# Patient Record
Sex: Male | Born: 1955 | Race: White | Hispanic: No | Smoking: Never smoker
Health system: Southern US, Community
[De-identification: ages and names within clinical notes are randomized; demographics above are authoritative.]

## PROBLEM LIST (undated history)

## (undated) DIAGNOSIS — G4730 Sleep apnea, unspecified: Secondary | ICD-10-CM

## (undated) DIAGNOSIS — E119 Type 2 diabetes mellitus without complications: Secondary | ICD-10-CM

## (undated) DIAGNOSIS — T8859XA Other complications of anesthesia, initial encounter: Secondary | ICD-10-CM

## (undated) DIAGNOSIS — Z87442 Personal history of urinary calculi: Secondary | ICD-10-CM

## (undated) DIAGNOSIS — I10 Essential (primary) hypertension: Secondary | ICD-10-CM

## (undated) DIAGNOSIS — T4145XA Adverse effect of unspecified anesthetic, initial encounter: Secondary | ICD-10-CM

## (undated) DIAGNOSIS — I639 Cerebral infarction, unspecified: Secondary | ICD-10-CM

## (undated) HISTORY — PX: OTHER SURGICAL HISTORY: SHX169

---

## 2015-02-12 ENCOUNTER — Ambulatory Visit (INDEPENDENT_AMBULATORY_CARE_PROVIDER_SITE_OTHER): Payer: No Typology Code available for payment source | Admitting: Urology

## 2015-02-12 ENCOUNTER — Other Ambulatory Visit: Payer: Self-pay | Admitting: Urology

## 2015-02-12 DIAGNOSIS — N401 Enlarged prostate with lower urinary tract symptoms: Secondary | ICD-10-CM

## 2015-02-12 DIAGNOSIS — F5221 Male erectile disorder: Secondary | ICD-10-CM

## 2015-02-12 DIAGNOSIS — N402 Nodular prostate without lower urinary tract symptoms: Secondary | ICD-10-CM

## 2015-02-12 DIAGNOSIS — Z87442 Personal history of urinary calculi: Secondary | ICD-10-CM

## 2015-02-19 ENCOUNTER — Other Ambulatory Visit: Payer: Self-pay | Admitting: Urology

## 2015-02-19 DIAGNOSIS — N402 Nodular prostate without lower urinary tract symptoms: Secondary | ICD-10-CM

## 2015-02-26 ENCOUNTER — Encounter (HOSPITAL_COMMUNITY): Payer: Self-pay

## 2015-02-26 ENCOUNTER — Ambulatory Visit (HOSPITAL_COMMUNITY)
Admission: RE | Admit: 2015-02-26 | Discharge: 2015-02-26 | Disposition: A | Discharge status: Home / Self Care | Payer: No Typology Code available for payment source | Source: Ambulatory Visit | Attending: Urology | Admitting: Urology

## 2015-02-26 DIAGNOSIS — N402 Nodular prostate without lower urinary tract symptoms: Secondary | ICD-10-CM | POA: Insufficient documentation

## 2015-02-26 DIAGNOSIS — R972 Elevated prostate specific antigen [PSA]: Secondary | ICD-10-CM | POA: Diagnosis not present

## 2015-02-26 HISTORY — DX: Type 2 diabetes mellitus without complications: E11.9

## 2015-02-26 HISTORY — DX: Essential (primary) hypertension: I10

## 2015-02-26 MED ORDER — GENTAMICIN SULFATE 40 MG/ML IJ SOLN
INTRAMUSCULAR | Status: AC
  Administered 2015-02-26: 80 mg via INTRAMUSCULAR
  Filled 2015-02-26: qty 2

## 2015-02-26 MED ORDER — LIDOCAINE HCL (PF) 2 % IJ SOLN
INTRAMUSCULAR | Status: AC
  Administered 2015-02-26: 10 mL
  Filled 2015-02-26: qty 10

## 2015-02-26 MED ORDER — GENTAMICIN SULFATE 40 MG/ML IJ SOLN
80.0000 mg | Freq: Once | INTRAMUSCULAR | Status: AC
  Administered 2015-02-26: 80 mg via INTRAMUSCULAR

## 2015-02-26 MED ORDER — LIDOCAINE HCL (PF) 2 % IJ SOLN
10.0000 mL | Freq: Once | INTRAMUSCULAR | Status: AC
  Administered 2015-02-26: 10 mL

## 2015-02-26 NOTE — Discharge Instructions (Signed)
Transrectal Ultrasound-Guided Biopsy °A transrectal ultrasound-guided biopsy is a procedure to take samples of tissue from your prostate. Ultrasound images are used to guide the procedure. It is usually done to check the prostate gland for cancer. °BEFORE THE PROCEDURE °· Do not eat or drink after midnight on the night before your procedure. °· Take medicines as your doctor tells you. °· Your doctor may have you stop taking some medicines 5-7 days before the procedure. °· You will be given an enema before your procedure. During an enema, a liquid is put into your butt (rectum) to clear out waste. °· You may have lab tests the day of your procedure. °· Make plans to have someone drive you home. °PROCEDURE °· You will be given medicine to help you relax before the procedure. An IV tube will be put into one of your veins. It will be used to give fluids and medicine. °· You will be given medicine to reduce the risk of infection (antibiotic). °· You will be placed on your side. °· A probe with gel will be put in your butt. This is used to take pictures of your prostate and the area around it. °· A medicine to numb the area is put into your prostate. °· A biopsy needle is then inserted and guided to your prostate. °· Samples of prostate tissue are taken. The needle is removed. °· The samples are sent to a lab to be checked. Results are usually back in 2-3 days. °AFTER THE PROCEDURE °· You will be taken to a room where you will be watched until you are doing okay. °· You may have some pain in the area around your butt. You will be given medicines for this. °· You may be able to go home the same day. Sometimes, an overnight stay in the hospital is needed. °  °This information is not intended to replace advice given to you by your health care provider. Make sure you discuss any questions you have with your health care provider. °  °Document Released: 03/03/2009 Document Revised: 03/20/2013 Document Reviewed:  11/01/2012 °Elsevier Interactive Patient Education ©2016 [employer] ° °

## 2015-03-05 ENCOUNTER — Ambulatory Visit (INDEPENDENT_AMBULATORY_CARE_PROVIDER_SITE_OTHER): Payer: No Typology Code available for payment source | Admitting: Urology

## 2015-03-05 DIAGNOSIS — N401 Enlarged prostate with lower urinary tract symptoms: Secondary | ICD-10-CM

## 2015-03-05 DIAGNOSIS — N402 Nodular prostate without lower urinary tract symptoms: Secondary | ICD-10-CM | POA: Diagnosis not present

## 2015-06-04 ENCOUNTER — Ambulatory Visit (INDEPENDENT_AMBULATORY_CARE_PROVIDER_SITE_OTHER): Payer: No Typology Code available for payment source | Admitting: Urology

## 2015-06-04 DIAGNOSIS — N401 Enlarged prostate with lower urinary tract symptoms: Secondary | ICD-10-CM | POA: Diagnosis not present

## 2015-06-04 DIAGNOSIS — N402 Nodular prostate without lower urinary tract symptoms: Secondary | ICD-10-CM

## 2015-06-04 DIAGNOSIS — F5221 Male erectile disorder: Secondary | ICD-10-CM | POA: Diagnosis not present

## 2015-08-06 ENCOUNTER — Ambulatory Visit: Payer: No Typology Code available for payment source | Admitting: Urology

## 2017-10-26 ENCOUNTER — Ambulatory Visit (HOSPITAL_COMMUNITY)
Admission: RE | Admit: 2017-10-26 | Discharge: 2017-10-26 | Disposition: A | Discharge status: Home / Self Care | Payer: No Typology Code available for payment source | Source: Ambulatory Visit | Attending: Urology | Admitting: Urology

## 2017-10-26 ENCOUNTER — Other Ambulatory Visit: Payer: Self-pay | Admitting: Urology

## 2017-10-26 ENCOUNTER — Ambulatory Visit (INDEPENDENT_AMBULATORY_CARE_PROVIDER_SITE_OTHER): Payer: No Typology Code available for payment source | Admitting: Urology

## 2017-10-26 DIAGNOSIS — N200 Calculus of kidney: Secondary | ICD-10-CM

## 2017-10-26 DIAGNOSIS — N201 Calculus of ureter: Secondary | ICD-10-CM

## 2017-10-27 ENCOUNTER — Ambulatory Visit (HOSPITAL_COMMUNITY): Payer: No Typology Code available for payment source | Admitting: Anesthesiology

## 2017-10-27 ENCOUNTER — Inpatient Hospital Stay (HOSPITAL_COMMUNITY)
Admission: AD | Admit: 2017-10-27 | Discharge: 2017-11-01 | DRG: 694 | Disposition: A | Discharge status: Home / Self Care | Payer: No Typology Code available for payment source | Source: Ambulatory Visit | Attending: Urology | Admitting: Urology

## 2017-10-27 ENCOUNTER — Encounter (HOSPITAL_COMMUNITY): Payer: Self-pay | Admitting: *Deleted

## 2017-10-27 ENCOUNTER — Encounter (HOSPITAL_COMMUNITY)
Admission: AD | Disposition: A | Discharge status: Home / Self Care | Payer: Self-pay | Source: Ambulatory Visit | Attending: Urology

## 2017-10-27 ENCOUNTER — Ambulatory Visit (HOSPITAL_COMMUNITY): Payer: No Typology Code available for payment source

## 2017-10-27 DIAGNOSIS — N132 Hydronephrosis with renal and ureteral calculous obstruction: Secondary | ICD-10-CM | POA: Diagnosis present

## 2017-10-27 DIAGNOSIS — N210 Calculus in bladder: Secondary | ICD-10-CM | POA: Diagnosis present

## 2017-10-27 DIAGNOSIS — Z8673 Personal history of transient ischemic attack (TIA), and cerebral infarction without residual deficits: Secondary | ICD-10-CM

## 2017-10-27 DIAGNOSIS — Z7982 Long term (current) use of aspirin: Secondary | ICD-10-CM | POA: Diagnosis not present

## 2017-10-27 DIAGNOSIS — Z794 Long term (current) use of insulin: Secondary | ICD-10-CM | POA: Diagnosis not present

## 2017-10-27 DIAGNOSIS — Z79899 Other long term (current) drug therapy: Secondary | ICD-10-CM | POA: Diagnosis not present

## 2017-10-27 DIAGNOSIS — I10 Essential (primary) hypertension: Secondary | ICD-10-CM | POA: Diagnosis present

## 2017-10-27 DIAGNOSIS — E119 Type 2 diabetes mellitus without complications: Secondary | ICD-10-CM | POA: Diagnosis present

## 2017-10-27 DIAGNOSIS — R310 Gross hematuria: Secondary | ICD-10-CM | POA: Diagnosis present

## 2017-10-27 DIAGNOSIS — Z9889 Other specified postprocedural states: Secondary | ICD-10-CM

## 2017-10-27 DIAGNOSIS — N201 Calculus of ureter: Secondary | ICD-10-CM | POA: Diagnosis present

## 2017-10-27 HISTORY — PX: CYSTOSCOPY WITH LITHOLAPAXY: SHX1425

## 2017-10-27 HISTORY — DX: Cerebral infarction, unspecified: I63.9

## 2017-10-27 HISTORY — PX: HOLMIUM LASER APPLICATION: SHX5852

## 2017-10-27 LAB — BASIC METABOLIC PANEL
Anion gap: 11 (ref 5–15)
BUN: 17 mg/dL (ref 8–23)
CHLORIDE: 110 mmol/L (ref 98–111)
CO2: 25 mmol/L (ref 22–32)
CREATININE: 2.01 mg/dL — AB (ref 0.61–1.24)
Calcium: 9.4 mg/dL (ref 8.9–10.3)
GFR calc Af Amer: 39 mL/min — ABNORMAL LOW (ref >60)
GFR calc non Af Amer: 34 mL/min — ABNORMAL LOW (ref >60)
GLUCOSE: 154 mg/dL — AB (ref 70–99)
Potassium: 3.9 mmol/L (ref 3.5–5.1)
SODIUM: 146 mmol/L — AB (ref 135–145)

## 2017-10-27 LAB — CBC
HCT: 33.7 % — ABNORMAL LOW (ref 39.0–52.0)
Hemoglobin: 11.3 g/dL — ABNORMAL LOW (ref 13.0–17.0)
MCH: 29.1 pg (ref 26.0–34.0)
MCHC: 33.5 g/dL (ref 30.0–36.0)
MCV: 86.9 fL (ref 78.0–100.0)
PLATELETS: 176 10*3/uL (ref 150–400)
RBC: 3.88 MIL/uL — ABNORMAL LOW (ref 4.22–5.81)
RDW: 12.6 % (ref 11.5–15.5)
WBC: 5.6 10*3/uL (ref 4.0–10.5)

## 2017-10-27 LAB — HEMOGLOBIN A1C
Hgb A1c MFr Bld: 6.5 % — ABNORMAL HIGH (ref 4.8–5.6)
MEAN PLASMA GLUCOSE: 139.85 mg/dL

## 2017-10-27 LAB — MRSA PCR SCREENING: MRSA by PCR: NEGATIVE

## 2017-10-27 LAB — GLUCOSE, CAPILLARY
GLUCOSE-CAPILLARY: 154 mg/dL — AB (ref 70–99)
Glucose-Capillary: 130 mg/dL — ABNORMAL HIGH (ref 70–99)

## 2017-10-27 LAB — PROTIME-INR
INR: 1.09
PROTHROMBIN TIME: 14.1 s (ref 11.4–15.2)

## 2017-10-27 SURGERY — CYSTOSCOPY, WITH BLADDER CALCULUS LITHOLAPAXY
Anesthesia: General

## 2017-10-27 MED ORDER — DIPHENHYDRAMINE HCL 12.5 MG/5ML PO ELIX: 12.5000 mg | ORAL_SOLUTION | Freq: Four times a day (QID) | ORAL | Status: DC | PRN

## 2017-10-27 MED ORDER — MIDAZOLAM HCL 2 MG/2ML IJ SOLN
INTRAMUSCULAR | Status: DC | PRN
  Administered 2017-10-27: 2 mg via INTRAVENOUS

## 2017-10-27 MED ORDER — HYOSCYAMINE SULFATE 0.125 MG SL SUBL
0.1250 mg | SUBLINGUAL_TABLET | SUBLINGUAL | Status: DC | PRN
  Filled 2017-10-27: qty 1

## 2017-10-27 MED ORDER — MIDAZOLAM HCL 2 MG/2ML IJ SOLN
INTRAMUSCULAR | Status: AC
  Filled 2017-10-27: qty 2

## 2017-10-27 MED ORDER — LACTATED RINGERS IV SOLN
INTRAVENOUS | Status: DC
  Administered 2017-10-27: 14:00:00 via INTRAVENOUS

## 2017-10-27 MED ORDER — HYDRALAZINE HCL 20 MG/ML IJ SOLN
10.0000 mg | Freq: Once | INTRAMUSCULAR | Status: AC
  Administered 2017-10-27: 10 mg via INTRAVENOUS

## 2017-10-27 MED ORDER — FENTANYL CITRATE (PF) 100 MCG/2ML IJ SOLN
INTRAMUSCULAR | Status: AC
  Administered 2017-10-27: 50 ug via INTRAVENOUS
  Filled 2017-10-27: qty 2

## 2017-10-27 MED ORDER — LIDOCAINE 2% (20 MG/ML) 5 ML SYRINGE
INTRAMUSCULAR | Status: AC
  Filled 2017-10-27: qty 5

## 2017-10-27 MED ORDER — HYDROMORPHONE HCL 1 MG/ML IJ SOLN
INTRAMUSCULAR | Status: AC
  Administered 2017-10-27: 0.5 mg via INTRAVENOUS
  Filled 2017-10-27: qty 1

## 2017-10-27 MED ORDER — AMLODIPINE BESYLATE 5 MG PO TABS
5.0000 mg | ORAL_TABLET | Freq: Every day | ORAL | Status: DC
  Administered 2017-10-27 – 2017-11-01 (×6): 5 mg via ORAL
  Filled 2017-10-27 (×6): qty 1

## 2017-10-27 MED ORDER — ZOLPIDEM TARTRATE 5 MG PO TABS
5.0000 mg | ORAL_TABLET | Freq: Every evening | ORAL | Status: DC | PRN
  Administered 2017-10-28 – 2017-10-29 (×3): 5 mg via ORAL
  Filled 2017-10-27 (×3): qty 1

## 2017-10-27 MED ORDER — SODIUM CHLORIDE 0.9 % IR SOLN
Status: DC | PRN
  Administered 2017-10-27: 6000 mL

## 2017-10-27 MED ORDER — HYDROMORPHONE HCL 1 MG/ML IJ SOLN
0.2500 mg | INTRAMUSCULAR | Status: DC | PRN
  Administered 2017-10-27 (×4): 0.5 mg via INTRAVENOUS

## 2017-10-27 MED ORDER — LABETALOL HCL 5 MG/ML IV SOLN
INTRAVENOUS | Status: AC
  Administered 2017-10-27: 10 mg via INTRAVENOUS
  Filled 2017-10-27: qty 4

## 2017-10-27 MED ORDER — BELLADONNA ALKALOIDS-OPIUM 16.2-60 MG RE SUPP
1.0000 | Freq: Four times a day (QID) | RECTAL | Status: DC | PRN
  Administered 2017-10-27 (×2): 1 via RECTAL
  Filled 2017-10-27: qty 1

## 2017-10-27 MED ORDER — OXYBUTYNIN CHLORIDE 5 MG PO TABS
5.0000 mg | ORAL_TABLET | Freq: Three times a day (TID) | ORAL | Status: DC | PRN
  Administered 2017-10-27 – 2017-11-01 (×9): 5 mg via ORAL
  Filled 2017-10-27 (×9): qty 1

## 2017-10-27 MED ORDER — ONDANSETRON HCL 4 MG/2ML IJ SOLN
INTRAMUSCULAR | Status: DC | PRN
  Administered 2017-10-27: 4 mg via INTRAVENOUS

## 2017-10-27 MED ORDER — PHENYLEPHRINE 40 MCG/ML (10ML) SYRINGE FOR IV PUSH (FOR BLOOD PRESSURE SUPPORT)
PREFILLED_SYRINGE | INTRAVENOUS | Status: AC
  Filled 2017-10-27: qty 10

## 2017-10-27 MED ORDER — BELLADONNA ALKALOIDS-OPIUM 16.2-60 MG RE SUPP
RECTAL | Status: AC
  Administered 2017-10-27: 1 via RECTAL
  Filled 2017-10-27: qty 1

## 2017-10-27 MED ORDER — ONDANSETRON HCL 4 MG/2ML IJ SOLN: 4.0000 mg | Freq: Once | INTRAMUSCULAR | Status: DC | PRN

## 2017-10-27 MED ORDER — CEFAZOLIN SODIUM-DEXTROSE 2-4 GM/100ML-% IV SOLN
2.0000 g | INTRAVENOUS | Status: AC
  Administered 2017-10-27: 2 g via INTRAVENOUS
  Filled 2017-10-27: qty 100

## 2017-10-27 MED ORDER — OXYCODONE HCL 5 MG PO TABS
ORAL_TABLET | ORAL | Status: AC
  Administered 2017-10-27: 5 mg via ORAL
  Filled 2017-10-27: qty 1

## 2017-10-27 MED ORDER — PHENYLEPHRINE 40 MCG/ML (10ML) SYRINGE FOR IV PUSH (FOR BLOOD PRESSURE SUPPORT)
PREFILLED_SYRINGE | INTRAVENOUS | Status: DC | PRN
  Administered 2017-10-27: 40 ug via INTRAVENOUS

## 2017-10-27 MED ORDER — STERILE WATER FOR IRRIGATION IR SOLN
Status: DC | PRN
  Administered 2017-10-27: 30 mL

## 2017-10-27 MED ORDER — OXYCODONE HCL 5 MG PO TABS
5.0000 mg | ORAL_TABLET | Freq: Once | ORAL | Status: AC | PRN
  Administered 2017-10-27 (×2): 5 mg via ORAL

## 2017-10-27 MED ORDER — FENTANYL CITRATE (PF) 100 MCG/2ML IJ SOLN
INTRAMUSCULAR | Status: AC
  Filled 2017-10-27: qty 2

## 2017-10-27 MED ORDER — SODIUM CHLORIDE 0.9 % IR SOLN
3000.0000 mL | Status: DC
  Administered 2017-10-27 – 2017-11-01 (×8): 3000 mL

## 2017-10-27 MED ORDER — PROPOFOL 10 MG/ML IV BOLUS
INTRAVENOUS | Status: DC | PRN
  Administered 2017-10-27: 200 mg via INTRAVENOUS

## 2017-10-27 MED ORDER — HYDRALAZINE HCL 20 MG/ML IJ SOLN
INTRAMUSCULAR | Status: AC
  Filled 2017-10-27: qty 1

## 2017-10-27 MED ORDER — DIPHENHYDRAMINE HCL 50 MG/ML IJ SOLN: 12.5000 mg | Freq: Four times a day (QID) | INTRAMUSCULAR | Status: DC | PRN

## 2017-10-27 MED ORDER — DEXAMETHASONE SODIUM PHOSPHATE 10 MG/ML IJ SOLN
INTRAMUSCULAR | Status: DC | PRN
  Administered 2017-10-27: 10 mg via INTRAVENOUS

## 2017-10-27 MED ORDER — FENTANYL CITRATE (PF) 100 MCG/2ML IJ SOLN
INTRAMUSCULAR | Status: DC | PRN
  Administered 2017-10-27 (×2): 50 ug via INTRAVENOUS
  Administered 2017-10-27 (×2): 25 ug via INTRAVENOUS
  Administered 2017-10-27: 50 ug via INTRAVENOUS

## 2017-10-27 MED ORDER — SODIUM CHLORIDE 0.9 % IV SOLN
INTRAVENOUS | Status: DC
  Administered 2017-10-27 – 2017-11-01 (×8): via INTRAVENOUS

## 2017-10-27 MED ORDER — ATORVASTATIN CALCIUM 40 MG PO TABS
80.0000 mg | ORAL_TABLET | Freq: Every day | ORAL | Status: DC
  Administered 2017-10-27 – 2017-11-01 (×6): 80 mg via ORAL
  Filled 2017-10-27 (×6): qty 2

## 2017-10-27 MED ORDER — DEXAMETHASONE SODIUM PHOSPHATE 10 MG/ML IJ SOLN
INTRAMUSCULAR | Status: AC
  Filled 2017-10-27: qty 1

## 2017-10-27 MED ORDER — MIDAZOLAM HCL 2 MG/2ML IJ SOLN
2.0000 mg | Freq: Once | INTRAMUSCULAR | Status: AC
  Administered 2017-10-27: 2 mg via INTRAVENOUS

## 2017-10-27 MED ORDER — HYDROMORPHONE HCL 1 MG/ML IJ SOLN
0.5000 mg | INTRAMUSCULAR | Status: DC | PRN
  Administered 2017-10-27 (×2): 1 mg via INTRAVENOUS
  Filled 2017-10-27 (×2): qty 1

## 2017-10-27 MED ORDER — ONDANSETRON HCL 4 MG/2ML IJ SOLN
INTRAMUSCULAR | Status: AC
  Filled 2017-10-27: qty 2

## 2017-10-27 MED ORDER — HYDROMORPHONE HCL 1 MG/ML IJ SOLN: 0.5000 mg | INTRAMUSCULAR | Status: DC | PRN

## 2017-10-27 MED ORDER — PROPOFOL 10 MG/ML IV BOLUS
INTRAVENOUS | Status: AC
  Filled 2017-10-27: qty 20

## 2017-10-27 MED ORDER — OXYCODONE-ACETAMINOPHEN 5-325 MG PO TABS
1.0000 | ORAL_TABLET | ORAL | Status: DC | PRN
  Administered 2017-10-27 – 2017-10-29 (×4): 2 via ORAL
  Administered 2017-10-30: 1 via ORAL
  Administered 2017-10-30 (×2): 2 via ORAL
  Administered 2017-10-31 – 2017-11-01 (×2): 1 via ORAL
  Filled 2017-10-27 (×2): qty 2
  Filled 2017-10-27: qty 1
  Filled 2017-10-27 (×2): qty 2
  Filled 2017-10-27: qty 1
  Filled 2017-10-27: qty 2
  Filled 2017-10-27: qty 1
  Filled 2017-10-27: qty 2

## 2017-10-27 MED ORDER — HYDROMORPHONE HCL 1 MG/ML IJ SOLN
INTRAMUSCULAR | Status: AC
  Filled 2017-10-27: qty 1

## 2017-10-27 MED ORDER — OXYCODONE HCL 5 MG/5ML PO SOLN
5.0000 mg | Freq: Once | ORAL | Status: AC | PRN
  Filled 2017-10-27: qty 5

## 2017-10-27 MED ORDER — FENTANYL CITRATE (PF) 100 MCG/2ML IJ SOLN
50.0000 ug | Freq: Once | INTRAMUSCULAR | Status: AC
  Administered 2017-10-27: 50 ug via INTRAVENOUS

## 2017-10-27 MED ORDER — LABETALOL HCL 5 MG/ML IV SOLN
10.0000 mg | INTRAVENOUS | Status: AC | PRN
  Administered 2017-10-27 (×2): 10 mg via INTRAVENOUS

## 2017-10-27 MED ORDER — METOCLOPRAMIDE HCL 5 MG PO TABS
5.0000 mg | ORAL_TABLET | Freq: Three times a day (TID) | ORAL | Status: DC
  Administered 2017-10-27 – 2017-11-01 (×18): 5 mg via ORAL
  Filled 2017-10-27 (×17): qty 1

## 2017-10-27 MED ORDER — ONDANSETRON HCL 4 MG/2ML IJ SOLN
4.0000 mg | INTRAMUSCULAR | Status: DC | PRN
Start: 2017-10-27 — End: 2017-11-01
  Administered 2017-10-28 – 2017-10-30 (×2): 4 mg via INTRAVENOUS
  Filled 2017-10-27 (×2): qty 2

## 2017-10-27 MED ORDER — FENTANYL CITRATE (PF) 100 MCG/2ML IJ SOLN
INTRAMUSCULAR | Status: AC
  Administered 2017-10-27: 25 ug via INTRAVENOUS
  Filled 2017-10-27: qty 2

## 2017-10-27 MED ORDER — LIDOCAINE 2% (20 MG/ML) 5 ML SYRINGE
INTRAMUSCULAR | Status: DC | PRN
  Administered 2017-10-27: 100 mg via INTRAVENOUS

## 2017-10-27 MED ORDER — HYDRALAZINE HCL 20 MG/ML IJ SOLN: 10.0000 mg | Freq: Once | INTRAMUSCULAR | Status: DC

## 2017-10-27 MED ORDER — STERILE WATER FOR IRRIGATION IR SOLN
Status: DC | PRN
  Administered 2017-10-27: 6000 mL

## 2017-10-27 MED ORDER — FENTANYL CITRATE (PF) 100 MCG/2ML IJ SOLN
25.0000 ug | INTRAMUSCULAR | Status: DC | PRN
  Administered 2017-10-27: 25 ug via INTRAVENOUS
  Administered 2017-10-27: 50 ug via INTRAVENOUS
  Administered 2017-10-27 (×3): 25 ug via INTRAVENOUS

## 2017-10-27 MED ORDER — ACETAMINOPHEN 325 MG PO TABS
650.0000 mg | ORAL_TABLET | ORAL | Status: DC | PRN
  Administered 2017-10-29: 650 mg via ORAL
  Filled 2017-10-27: qty 2

## 2017-10-27 SURGICAL SUPPLY — 28 items
BAG URINE DRAINAGE (UROLOGICAL SUPPLIES) ×3 IMPLANT
BAG URO CATCHER STRL LF (MISCELLANEOUS) ×3 IMPLANT
CATH FOLEY 3WAY 30CC 22FR (CATHETERS) ×3 IMPLANT
CATH INTERMIT  6FR 70CM (CATHETERS) ×3 IMPLANT
CLOTH BEACON ORANGE TIMEOUT ST (SAFETY) ×3 IMPLANT
COVER FOOTSWITCH UNIV (MISCELLANEOUS) ×3 IMPLANT
COVER SURGICAL LIGHT HANDLE (MISCELLANEOUS) ×3 IMPLANT
EXTRACTOR STONE NITINOL NGAGE (UROLOGICAL SUPPLIES) IMPLANT
FIBER LASER FLEXIVA 1000 (UROLOGICAL SUPPLIES) ×3 IMPLANT
FIBER LASER FLEXIVA 365 (UROLOGICAL SUPPLIES) IMPLANT
FIBER LASER FLEXIVA 550 (UROLOGICAL SUPPLIES) ×3 IMPLANT
FIBER LASER TRAC TIP (UROLOGICAL SUPPLIES) IMPLANT
GLOVE BIO SURGEON STRL SZ8 (GLOVE) ×3 IMPLANT
GLOVE BIOGEL M 8.0 STRL (GLOVE) ×3 IMPLANT
GLOVE BIOGEL PI IND STRL 7.0 (GLOVE) ×2 IMPLANT
GLOVE BIOGEL PI INDICATOR 7.0 (GLOVE) ×4
GOWN STRL REUS W/TWL LRG LVL3 (GOWN DISPOSABLE) ×6 IMPLANT
GUIDEWIRE ANG ZIPWIRE 038X150 (WIRE) IMPLANT
GUIDEWIRE STR DUAL SENSOR (WIRE) ×3 IMPLANT
MANIFOLD NEPTUNE II (INSTRUMENTS) ×3 IMPLANT
PACK CYSTO (CUSTOM PROCEDURE TRAY) ×3 IMPLANT
SHEATH URETERAL 12FRX35CM (MISCELLANEOUS) IMPLANT
STENT CONTOUR 6FRX26X.038 (STENTS) IMPLANT
SYRINGE IRR TOOMEY STRL 70CC (SYRINGE) ×3 IMPLANT
TUBE FEEDING 8FR 16IN STR KANG (MISCELLANEOUS) IMPLANT
TUBING CONNECTING 10 (TUBING) ×2 IMPLANT
TUBING CONNECTING 10' (TUBING) ×1
TUBING INSUFFLATION 10FT LAP (TUBING) IMPLANT

## 2017-10-27 NOTE — H&P (Signed)
Urology Admission H&P  Chief Complaint: left flank apin  History of Present Illness: [name] is a 61yo with a hx of nephrolithiasis who presented to my office with severe left flank pain that was sharp, severe, constant and nonradiating. He had associated nausea and vomiting. He had a CT at the [place] which showed 2 9mm left proximal ureteral calculi and a 3cm bladder calculus. He denies any significant LUTS. No fevers/chills/sweats  Past Medical History:  Diagnosis Date  . Diabetes mellitus without complication (HCC)   . Hypertension   . Stroke [facility])    patient states this happene June 2019   History reviewed. No pertinent surgical history.  Home Medications:  Current Facility-Administered Medications  Medication Dose Route Frequency Provider Last Rate Last Dose  . ceFAZolin (ANCEF) IVPB 2g/100 mL premix  2 g Intravenous 30 min Pre-Op Malen Gauze, MD      . lactated ringers infusion   Intravenous Continuous Achille Rich, MD 75 mL/hr at 10/27/17 1340     Allergies: No Known Allergies  History reviewed. No pertinent family history. Social History:  reports that he has never smoked. He has never used smokeless tobacco. His alcohol and drug histories are not on file.  Review of Systems  Gastrointestinal: Positive for nausea and vomiting.  Genitourinary: Positive for flank pain.  All other systems reviewed and are negative.   Physical Exam:  Vital signs in last 24 hours: Temp:  [97.5 F (36.4 C)] 97.5 F (36.4 C) (08/01 1316) Pulse Rate:  [72] 72 (08/01 1316) Resp:  [20] 20 (08/01 1316) BP: (125)/(95) 125/95 (08/01 1316) SpO2:  [95 %] 95 % (08/01 1316) Weight:  [109.3 kg (241 lb)] 109.3 kg (241 lb) (08/01 1335) Physical Exam  Constitutional: He is oriented to person, place, and time. He appears well-developed and well-nourished.  HENT:  Head: Normocephalic and atraumatic.  Eyes: Pupils are equal, round, and reactive to light. EOM are normal.  Neck: Normal range of  motion. No thyromegaly present.  Cardiovascular: Normal rate and regular rhythm.  Respiratory: Effort normal. No respiratory distress.  GI: Soft. He exhibits no distension.  Musculoskeletal: Normal range of motion. He exhibits no edema.  Neurological: He is alert and oriented to person, place, and time.  Skin: Skin is warm and dry.  Psychiatric: He has a normal mood and affect. His behavior is normal. Judgment and thought content normal.    Laboratory Data:  Results for orders placed or performed during the hospital encounter of 10/27/17 (from the past 24 hour(s))  Glucose, capillary     Status: Abnormal   Collection Time: 10/27/17  1:20 PM  Result Value Ref Range   Glucose-Capillary 154 (H) 70 - 99 mg/dL  Basic metabolic panel     Status: Abnormal   Collection Time: 10/27/17  1:39 PM  Result Value Ref Range   Sodium 146 (H) 135 - 145 mmol/L   Potassium 3.9 3.5 - 5.1 mmol/L   Chloride 110 98 - 111 mmol/L   CO2 25 22 - 32 mmol/L   Glucose, Bld 154 (H) 70 - 99 mg/dL   BUN 17 8 - 23 mg/dL   Creatinine, Ser 2.95 (H) 0.61 - 1.24 mg/dL   Calcium 9.4 8.9 - 62.1 mg/dL   GFR calc non Af Amer 34 (L) >60 mL/min   GFR calc Af Amer 39 (L) >60 mL/min   Anion gap 11 5 - 15  CBC     Status: Abnormal   Collection Time: 10/27/17  1:39 PM  Result Value Ref Range   WBC 5.6 4.0 - 10.5 K/uL   RBC 3.88 (L) 4.22 - 5.81 MIL/uL   Hemoglobin 11.3 (L) 13.0 - 17.0 g/dL   HCT 78.233.7 (L) 95.639.0 - 21.352.0 %   MCV 86.9 78.0 - 100.0 fL   MCH 29.1 26.0 - 34.0 pg   MCHC 33.5 30.0 - 36.0 g/dL   RDW 08.612.6 57.811.5 - 46.915.5 %   Platelets 176 150 - 400 K/uL   No results found for this or any previous visit (from the past 240 hour(s)). Creatinine: Recent Labs    10/27/17 1339  CREATININE 2.01*   Baseline Creatinine: unknown  Impression/Assessment:  61yo with left ureteral calculus and bladder calculus  Plan:  The risks/benefits/alternatives to cystolithalopaxy and left ureteral stent placement was explained to the  patient and he understands and wishes to proceed with surgery  Dillon Phillips 10/27/2017, 2:46 PM

## 2017-10-27 NOTE — Anesthesia Postprocedure Evaluation (Signed)
Anesthesia Post Note  Patient: Dillon Phillips  Procedure(Phillips) Performed: CYSTOSCOPY WITH LITHOLAPAXY (N/A ) HOLMIUM LASER APPLICATION (N/A )     Patient location during evaluation: PACU Anesthesia Type: General Level of consciousness: awake and alert Pain management: pain level not controlled Vital Signs Assessment: post-procedure vital signs reviewed and stable Respiratory status: spontaneous breathing, nonlabored ventilation, respiratory function stable and patient connected to nasal cannula oxygen Cardiovascular status: stable and tachycardic (HTN) Postop Assessment: no apparent nausea or vomiting Anesthetic complications: no Comments: Pt continues to collect blood clots that are obstructing his foley catheter.  He has continued discomfort and HTN as a result.  Treatment with pain medicine and anti-hypertensives has had minimal impact for improvement.  Dr Ronne BinningMcKenzie at bedside flushing foley to remove clots.  Plan to transfer to SDU for closer BP monitoring and foley care.    Last Vitals:  Vitals:   10/27/17 1925 10/27/17 1930  BP: (!) 187/103 (!) 182/96  Pulse:  66  Resp:  18  Temp:    SpO2:  100%    Last Pain:  Vitals:   10/27/17 1709  TempSrc:   PainSc: 5                  Dillon Phillips

## 2017-10-27 NOTE — Anesthesia Preprocedure Evaluation (Addendum)
Anesthesia Evaluation  Patient identified by MRN, date of birth, ID band Patient awake    Reviewed: Allergy & Precautions, H&P , NPO status , Patient's Chart, lab work & pertinent test results  History of Anesthesia Complications Negative for: history of anesthetic complications  Airway Mallampati: II   Neck ROM: full    Dental   Pulmonary neg pulmonary ROS,    breath sounds clear to auscultation       Cardiovascular hypertension,  Rhythm:regular Rate:Normal   EKG - SR, Inferior Q waves  '19 Carotid US - Mild plaque deposition with bilateral internal carotid artery stenosis of 0-39%.   '19 TTE - EF 60 to 65%. Grade 1 diastolic dysfunction. Mild, concentric LVH. Technically very difficult study.    Neuro/Psych CVA negative psych ROS   GI/Hepatic negative GI ROS, Neg liver ROS,   Endo/Other  diabetes, Insulin Dependent Obesity   Renal/GU negative Renal ROS  negative genitourinary   Musculoskeletal negative musculoskeletal ROS (+)   Abdominal   Peds  Hematology negative hematology ROS (+)   Anesthesia Other Findings   Reproductive/Obstetrics                           Anesthesia Physical Anesthesia Plan  ASA: III  Anesthesia Plan: General   Post-op Pain Management:    Induction: Intravenous  PONV Risk Score and Plan: 2 and Treatment may vary due to age or medical condition, Ondansetron, Dexamethasone and Midazolam  Airway Management Planned: Oral ETT  Additional Equipment: None  Intra-op Plan:   Post-operative Plan: Extubation in OR  Informed Consent: I have reviewed the patients History and Physical, chart, labs and discussed the procedure including the risks, benefits and alternatives for the proposed anesthesia with the patient or authorized representative who has indicated his/her understanding and acceptance.     Plan Discussed with: CRNA, Anesthesiologist and  Surgeon  Anesthesia Plan Comments:        Anesthesia Quick Evaluation

## 2017-10-27 NOTE — Transfer of Care (Signed)
Immediate Anesthesia Transfer of Care Note  Patient: Dillon Phillips  Procedure(s) Performed: CYSTOSCOPY WITH LITHOLAPAXY (N/A ) HOLMIUM LASER APPLICATION (N/A )  Patient Location: PACU  Anesthesia Type:General  Level of Consciousness: awake, alert , oriented and patient cooperative  Airway & Oxygen Therapy: Patient Spontanous Breathing and Patient connected to face mask oxygen  Post-op Assessment: Report given to RN, Post -op Vital signs reviewed and stable and Patient moving all extremities  Post vital signs: Reviewed and stable  Last Vitals:  Vitals Value Taken Time  BP 169/110 10/27/2017  4:45 PM  Temp    Pulse 82 10/27/2017  4:46 PM  Resp 9 10/27/2017  4:46 PM  SpO2 100 % 10/27/2017  4:46 PM  Vitals shown include unvalidated device data.  Last Pain:  Vitals:   10/27/17 1335  TempSrc:   PainSc: 0-No pain         Complications: No apparent anesthesia complications

## 2017-10-27 NOTE — Op Note (Signed)
Preoperative diagnosis:  bladder calculus, left ureteral calculi  Postoperative diagnosis: same  Procedure: 1 cystoscopy 2. cystolithalopaxy for a stone greater than 2.5cm  Attending: Wilkie AyePatrick Jhana Giarratano  Anesthesia: General  Estimated blood loss: Minimal  Drains: 22 french foley  Specimens: 1. Bladder calculus  Antibiotics: ancef  Findings:  Unable to identify the ureteral orifices due to significant median lobe protrusion into the base of th bladder.  3cm bladder calculus  Indications: Patient is a 62 year old male with bladder calculus and left ureteral calculi.  After discussing treatment options, they decided proceed with cystolithalopaxy and left ureteral stent placement.  Procedure her in detail: The patient was brought to the operating room and a brief timeout was done to ensure correct patient, correct procedure, correct site.  General anesthesia was administered patient was placed in dorsal lithotomy position.  Their genitalia was then prepped and draped in usual sterile fashion.  A rigid 22 French cystoscope was passed in the urethra and the bladder.  Bladder was inspected and we noted no masses or lesions.  the ureteral orifices were unable to be identified. Using the 1000nm laser fiber the bladder calculus was fragmented and the fragments were then irrigated from the bladder. The stone fragments were the sent for analysis. The bladder was then drained and this concluded the procedure which was well tolerated by patient.  Complications: None  Condition: Stable, extubated, transferred to PACU  Plan: Patient is to be admitted for left nephrostomy tube placement tomorrow. He will then be discharged and he will followup in 2 weeks for left PCNL.

## 2017-10-27 NOTE — Progress Notes (Signed)
Dr Ronne BinningMcKenzie notified urinary CBI clotting after frewquent irrigation with NSS.  Instructed to irrigate and place traction on foley.  MD will come to bedside after completion of procedure he is doing.

## 2017-10-27 NOTE — Anesthesia Procedure Notes (Signed)
Procedure Name: LMA Insertion Date/Time: 10/27/2017 3:07 PM Performed by: Florene Routeeardon, Jocilynn Grade L, CRNA Patient Re-evaluated:Patient Re-evaluated prior to induction Oxygen Delivery Method: Circle system utilized Preoxygenation: Pre-oxygenation with 100% oxygen Induction Type: IV induction Ventilation: Mask ventilation without difficulty LMA: LMA inserted LMA Size: 5.0 Number of attempts: 1 Placement Confirmation: positive ETCO2 and breath sounds checked- equal and bilateral Tube secured with: Tape Dental Injury: Teeth and Oropharynx as per pre-operative assessment

## 2017-10-27 NOTE — Progress Notes (Signed)
Bladder scan 95mls, pt complaining ogf incontrollable urge to void, CBI clotted.  Dr Rogelia MireMcKemzie is aware pt CBI clotting and is to see pt after completion of pt litho.  Dr Chaney MallingHodierne aware

## 2017-10-28 ENCOUNTER — Inpatient Hospital Stay (HOSPITAL_COMMUNITY): Payer: No Typology Code available for payment source

## 2017-10-28 ENCOUNTER — Other Ambulatory Visit: Payer: Self-pay

## 2017-10-28 ENCOUNTER — Encounter (HOSPITAL_COMMUNITY): Payer: Self-pay | Admitting: Urology

## 2017-10-28 HISTORY — PX: IR NEPHROSTOMY PLACEMENT LEFT: IMG6063

## 2017-10-28 HISTORY — PX: IR NEPHROSTOGRAM RIGHT INITIAL PLACEMENT: IMG6058

## 2017-10-28 LAB — GLUCOSE, CAPILLARY
GLUCOSE-CAPILLARY: 242 mg/dL — AB (ref 70–99)
GLUCOSE-CAPILLARY: 196 mg/dL — AB (ref 70–99)
GLUCOSE-CAPILLARY: 164 mg/dL — AB (ref 70–99)
Glucose-Capillary: 153 mg/dL — ABNORMAL HIGH (ref 70–99)

## 2017-10-28 LAB — COMPREHENSIVE METABOLIC PANEL
ALBUMIN: 3.2 g/dL — AB (ref 3.5–5.0)
ALT: 11 U/L (ref 0–44)
ANION GAP: 11 (ref 5–15)
AST: 17 U/L (ref 15–41)
Alkaline Phosphatase: 64 U/L (ref 38–126)
BILIRUBIN TOTAL: 0.8 mg/dL (ref 0.3–1.2)
BUN: 22 mg/dL (ref 8–23)
CHLORIDE: 104 mmol/L (ref 98–111)
CO2: 25 mmol/L (ref 22–32)
Calcium: 9.3 mg/dL (ref 8.9–10.3)
Creatinine, Ser: 2.43 mg/dL — ABNORMAL HIGH (ref 0.61–1.24)
GFR calc Af Amer: 31 mL/min — ABNORMAL LOW (ref >60)
GFR calc non Af Amer: 27 mL/min — ABNORMAL LOW (ref >60)
GLUCOSE: 219 mg/dL — AB (ref 70–99)
POTASSIUM: 4.6 mmol/L (ref 3.5–5.1)
Sodium: 140 mmol/L (ref 135–145)
TOTAL PROTEIN: 6.7 g/dL (ref 6.5–8.1)

## 2017-10-28 LAB — CBC WITH DIFFERENTIAL/PLATELET
BASOS ABS: 0 10*3/uL (ref 0.0–0.1)
BASOS PCT: 0 %
EOS ABS: 0 10*3/uL (ref 0.0–0.7)
Eosinophils Relative: 0 %
HEMATOCRIT: 30.5 % — AB (ref 39.0–52.0)
Hemoglobin: 10.1 g/dL — ABNORMAL LOW (ref 13.0–17.0)
Lymphocytes Relative: 9 %
Lymphs Abs: 1.6 10*3/uL (ref 0.7–4.0)
MCH: 28.1 pg (ref 26.0–34.0)
MCHC: 33.1 g/dL (ref 30.0–36.0)
MCV: 85 fL (ref 78.0–100.0)
MONO ABS: 2 10*3/uL (ref 0.1–1.0)
MONOS PCT: 12 %
Neutro Abs: 13.3 10*3/uL (ref 1.7–7.7)
Neutrophils Relative %: 79 %
Platelets: 189 10*3/uL (ref 150–400)
RBC: 3.59 MIL/uL — ABNORMAL LOW (ref 4.22–5.81)
RDW: 12.7 % (ref 11.5–15.5)
WBC: 17.2 10*3/uL — ABNORMAL HIGH (ref 4.0–10.5)

## 2017-10-28 MED ORDER — FENTANYL CITRATE (PF) 100 MCG/2ML IJ SOLN
INTRAMUSCULAR | Status: AC | PRN
  Administered 2017-10-28: 25 ug via INTRAVENOUS
  Administered 2017-10-28: 50 ug via INTRAVENOUS
  Administered 2017-10-28: 25 ug via INTRAVENOUS

## 2017-10-28 MED ORDER — IOPAMIDOL (ISOVUE-300) INJECTION 61%
INTRAVENOUS | Status: AC
  Administered 2017-10-28: 10 mL
  Filled 2017-10-28: qty 50

## 2017-10-28 MED ORDER — IOPAMIDOL (ISOVUE-300) INJECTION 61%
INTRAVENOUS | Status: AC
  Administered 2017-10-28: 50 mL
  Filled 2017-10-28: qty 50

## 2017-10-28 MED ORDER — HYDROMORPHONE HCL 1 MG/ML IJ SOLN
1.0000 mg | INTRAMUSCULAR | Status: DC | PRN
Start: 2017-10-28 — End: 2017-11-01
  Administered 2017-10-28: 2 mg via INTRAVENOUS
  Administered 2017-10-28 (×3): 1 mg via INTRAVENOUS
  Administered 2017-10-29: 2 mg via INTRAVENOUS
  Filled 2017-10-28 (×2): qty 2
  Filled 2017-10-28: qty 1
  Filled 2017-10-28 (×2): qty 2
  Filled 2017-10-28: qty 1

## 2017-10-28 MED ORDER — IOPAMIDOL (ISOVUE-300) INJECTION 61%
25.0000 mL | Freq: Once | INTRAVENOUS | Status: AC | PRN
  Administered 2017-10-28: 50 mL

## 2017-10-28 MED ORDER — DIAZEPAM 5 MG PO TABS
5.0000 mg | ORAL_TABLET | ORAL | Status: AC
  Administered 2017-10-28: 5 mg via ORAL
  Filled 2017-10-28: qty 1

## 2017-10-28 MED ORDER — INSULIN ASPART 100 UNIT/ML ~~LOC~~ SOLN
0.0000 [IU] | Freq: Three times a day (TID) | SUBCUTANEOUS | Status: DC
  Administered 2017-10-28: 3 [IU] via SUBCUTANEOUS
  Administered 2017-10-28: 5 [IU] via SUBCUTANEOUS
  Administered 2017-10-28 – 2017-10-29 (×2): 3 [IU] via SUBCUTANEOUS
  Administered 2017-10-29: 2 [IU] via SUBCUTANEOUS
  Administered 2017-10-29: 3 [IU] via SUBCUTANEOUS
  Administered 2017-10-30: 2 [IU] via SUBCUTANEOUS
  Administered 2017-10-31: 3 [IU] via SUBCUTANEOUS
  Administered 2017-10-31 – 2017-11-01 (×4): 2 [IU] via SUBCUTANEOUS

## 2017-10-28 MED ORDER — MIDAZOLAM HCL 2 MG/2ML IJ SOLN
INTRAMUSCULAR | Status: AC
  Filled 2017-10-28: qty 4

## 2017-10-28 MED ORDER — NALOXONE HCL 0.4 MG/ML IJ SOLN
INTRAMUSCULAR | Status: AC
  Filled 2017-10-28: qty 1

## 2017-10-28 MED ORDER — LIDOCAINE HCL 1 % IJ SOLN
INTRAMUSCULAR | Status: AC | PRN
  Administered 2017-10-28: 10 mL

## 2017-10-28 MED ORDER — MIDAZOLAM HCL 2 MG/2ML IJ SOLN
INTRAMUSCULAR | Status: AC | PRN
  Administered 2017-10-28: 1 mg via INTRAVENOUS
  Administered 2017-10-28 (×2): 0.5 mg via INTRAVENOUS

## 2017-10-28 MED ORDER — CEFAZOLIN SODIUM-DEXTROSE 2-4 GM/100ML-% IV SOLN
2.0000 g | INTRAVENOUS | Status: AC
  Administered 2017-10-28: 2 g via INTRAVENOUS
  Filled 2017-10-28: qty 100

## 2017-10-28 MED ORDER — LIDOCAINE HCL 1 % IJ SOLN
INTRAMUSCULAR | Status: AC
  Filled 2017-10-28: qty 20

## 2017-10-28 MED ORDER — IOPAMIDOL (ISOVUE-300) INJECTION 61%
50.0000 mL | Freq: Once | INTRAVENOUS | Status: AC | PRN
  Administered 2017-10-28: 10 mL
  Administered 2017-10-28: 50 mL

## 2017-10-28 MED ORDER — IOPAMIDOL (ISOVUE-300) INJECTION 61%
INTRAVENOUS | Status: AC
  Filled 2017-10-28: qty 50

## 2017-10-28 MED ORDER — FLUMAZENIL 0.5 MG/5ML IV SOLN
INTRAVENOUS | Status: AC
  Filled 2017-10-28: qty 5

## 2017-10-28 MED ORDER — FENTANYL CITRATE (PF) 100 MCG/2ML IJ SOLN
INTRAMUSCULAR | Status: AC
  Filled 2017-10-28: qty 4

## 2017-10-28 NOTE — Consult Note (Signed)
Chief Complaint: Patient was seen in consultation today for bilateral percutaneous nephrostomies  Referring Physician(s): McKenzie, Luisa Hart  Supervising Physician: Gilmer Mor  Patient Status: [facility] - In-pt  History of Present Illness: Dillon Phillips is a 62 y.o. male with history of diabetes, hypertension, prior stroke and known nephrolithiasis who was  admitted to [facility] with left flank pain, nausea, vomiting.  Outside imaging had revealed bladder calculus as well as left ureteral calculi.  He underwent cystoscopy with cystolitholapaxy yesterday with fragmentation/irrigation of bladder stone.  Ureteral orifices were unable to be visualized.  Follow-up CT study performed today revealed blood product within the bladder with scattered small calculi in the bladder and elevation of bladder by enlarged prostate gland.  There was potential distal right ureteral calculus at the right vesicoureteral junction, partially obstructing calculus at the left ureteropelvic junction and bilateral hydronephrosis.  Request now received from urology for bilateral percutaneous nephrostomies.  Past Medical History:  Diagnosis Date  . Diabetes mellitus without complication (HCC)   . Hypertension   . Stroke [facility])    patient states this happene June 2019    Past Surgical History:  Procedure Laterality Date  . CYSTOSCOPY WITH LITHOLAPAXY N/A 10/27/2017   Procedure: CYSTOSCOPY WITH LITHOLAPAXY;  Surgeon: Malen Gauze, MD;  Location: [facility] ORS;  Service: Urology;  Laterality: N/A;  . HOLMIUM LASER APPLICATION N/A 10/27/2017   Procedure: HOLMIUM LASER APPLICATION;  Surgeon: Malen Gauze, MD;  Location: [facility] ORS;  Service: Urology;  Laterality: N/A;    Allergies: Patient has no known allergies.  Medications: Prior to Admission medications   Medication Sig Start Date End Date Taking? Authorizing Provider  amLODipine (NORVASC) 5 MG tablet Take 5 mg by mouth daily. 09/14/17  Yes [provider]    aspirin EC 81 MG tablet Take 81 mg by mouth daily.   Yes [provider]  atorvastatin (LIPITOR) 80 MG tablet Take 80 mg by mouth daily. 09/14/17  Yes [provider]  EQ STOOL SOFTENER/LAXATIVE 8.6-50 MG tablet Take 1 tablet by mouth 2 (two) times daily. 09/14/17  Yes [provider]  insulin aspart protamine - aspart (NOVOLOG MIX 70/30 FLEXPEN) (70-30) 100 UNIT/ML FlexPen Inject 16 Units into the skin at bedtime.   Yes [provider]  lisinopril (PRINIVIL,ZESTRIL) 10 MG tablet Take 10 mg by mouth daily. 09/14/17  Yes [provider]  meclizine (ANTIVERT) 25 MG tablet Take 25 mg by mouth every 8 (eight) hours as needed for dizziness. 09/27/17  Yes [provider]  metoCLOPramide (REGLAN) 5 MG tablet Take 5 mg by mouth. Every 6 to 8 hours 10/12/17  Yes [provider]  ondansetron (ZOFRAN-ODT) 4 MG disintegrating tablet Take 4 mg by mouth every 4 (four) hours as needed for nausea. 09/27/17  Yes [provider]  oxycodone (OXY-IR) 5 MG capsule Take 5 mg by mouth every 6 (six) hours as needed for pain. 10/13/17  Yes [provider]     History reviewed. No pertinent family history.  Social History   Socioeconomic History  . Marital status: Divorced    Spouse name: Not on file  . Number of children: Not on file  . Years of education: Not on file  . Highest education level: Not on file  Occupational History  . Not on file  Social Needs  . Financial resource strain: Not on file  . Food insecurity:    Worry: Not on file    Inability: Not on file  . Transportation needs:  Medical: Not on file    Non-medical: Not on file  Tobacco Use  . Smoking status: Never Smoker  . Smokeless tobacco: Never Used  Substance and Sexual Activity  . Alcohol use: Not on file  . Drug use: Not on file  . Sexual activity: Not on file  Lifestyle  . Physical activity:    Days per week: Not on file    Minutes per session: Not on file   . Stress: Not on file  Relationships  . Social connections:    Talks on phone: Not on file    Gets together: Not on file    Attends religious service: Not on file    Active member of club or organization: Not on file    Attends meetings of clubs or organizations: Not on file    Relationship status: Not on file  Other Topics Concern  . Not on file  Social History Narrative  . Not on file      Review of Systems currently denies fever, headache, chest pain, dyspnea, cough, back pain, nausea, vomiting.  He does have some mild hematuria and left lower quadrant discomfort  Vital Signs: BP 135/79 (BP Location: Left Arm)   Pulse 67   Temp 98.5 F (36.9 C) (Oral)   Resp (!) 26   Ht 6\' 2"  (1.88 m)   Wt 239 lb 3.2 oz (108.5 kg)   SpO2 93%   BMI 30.71 kg/m   Physical Exam awake, alert.  Chest clear to auscultation bilaterally.  Heart with normal rate, occasional ectopy.  Abdomen soft, positive bowel sounds, mild left lower quadrant tenderness to palpation.  No significant lower extremity edema.  Currently without flank pain.  Imaging: Dg Abd 1 View  Result Date: 10/26/2017 CLINICAL DATA:  Left-sided abdominal pain EXAM: ABDOMEN - 1 VIEW COMPARISON:  None. FINDINGS: Scattered large and small bowel gas is noted. There are calcifications overlying both kidneys consistent with nonobstructing renal stones. Two calcifications are noted to the left of the midline in the expected region of the proximal left ureter and may represent renal pelvic or proximal ureteral stones. An irregular bladder calculus is noted as well. No bony abnormality is seen. IMPRESSION: Bilateral renal calculi. Two of these stones on the left appear to lie in the expected region of the renal pelvis or proximal ureter. CT urography may be helpful for further evaluation. Electronically Signed   By: Alcide Clever M.D.   On: 10/26/2017 21:23   Dg C-arm 1-60 Min-no Report  Result Date: 10/27/2017 Fluoroscopy was utilized by the  requesting physician.  No radiographic interpretation.   Ct Renal Stone Study  Result Date: 10/28/2017 CLINICAL DATA:  Bladder calculus, LEFT renal calculi, post cystoscopy EXAM: CT ABDOMEN AND PELVIS WITHOUT CONTRAST TECHNIQUE: Multidetector CT imaging of the abdomen and pelvis was performed following the standard protocol without IV contrast. COMPARISON:  Plain film 10/26/2017 FINDINGS: Lower chest: 4 mm nodule in the RIGHT middle lobe (image 21/3) Hepatobiliary: No focal hepatic lesion. No biliary duct dilatation. Gallbladder is normal. Common bile duct is normal. Pancreas: Pancreas is normal. No ductal dilatation. No pancreatic inflammation. Spleen: Normal spleen Adrenals/urinary tract: Adrenal glands normal. RIGHT kidney: bulky branching calcification lower pole of the RIGHT kidney measures 19 mm (image 45/2). There is mild hydroureter on the RIGHT. Potential obstructing calculus in the distal RIGHT ureter at the level of the vesicoureteral junction. Calculus measures 4 mm (image 78/2). LEFT kidney: 2 calculi in the LEFT kidney. One in the lower  pole measuring 7 mm. One in the renal pelvis measuring 7 mm. Large calculus at the LEFT ureteropelvic junction measuring 8 mm (image 45/2). Mild hydronephrosis on the LEFT. No distal LEFT ureteral calculi. Foley catheter within the bladder. There is high-density material with locules of gas in the bladder consistent with blood products/hematoma. Scattered calcifications within the bladder. The prostate gland elevates the base the bladder. Stomach/Bowel: Stomach, small bowel, appendix, and cecum are normal. The colon and rectosigmoid colon are normal. Vascular/Lymphatic: Abdominal aorta is normal caliber. No periportal or retroperitoneal adenopathy. No pelvic adenopathy. Reproductive: Prostate gland is enlarged measuring 6.7 by 6.2 by 9.1 cm (volume = 200 cm^3) Other: Bilateral fat filled inguinal Musculoskeletal: Is IMPRESSION: 1. Postprocedural blood product within  the bladder. Scattered small calculi in the bladder. Foley catheter within the bladder. Bladder elevated by enlarged prostate gland. 2. Potential distal RIGHT ureteral calculus at the RIGHT vesicoureteral junction. 3. Partially obstructing calculus at the LEFT ureteropelvic junction. 4. Bilateral nephrolithiasis. These results will be called to the ordering clinician or representative by the Radiologist Assistant, and communication documented in the PACS or zVision Dashboard. Electronically Signed   By: Genevive BiStewart  Edmunds M.D.   On: 10/28/2017 09:24    Labs:  CBC: Recent Labs    10/27/17 1339  WBC 5.6  HGB 11.3*  HCT 33.7*  PLT 176    COAGS: Recent Labs    10/27/17 2031  INR 1.09    BMP: Recent Labs    10/27/17 1339  NA 146*  K 3.9  CL 110  CO2 25  GLUCOSE 154*  BUN 17  CALCIUM 9.4  CREATININE 2.01*  GFRNONAA 34*  GFRAA 39*    LIVER FUNCTION TESTS: No results for input(s): BILITOT, AST, ALT, ALKPHOS, PROT, ALBUMIN in the last 8760 hours.  TUMOR MARKERS: No results for input(s): AFPTM, CEA, CA199, CHROMGRNA in the last 8760 hours.  Assessment and Plan:  62 y.o. male with history of diabetes, hypertension, prior stroke and known nephrolithiasis who was  ad[facility] with left flank pain, nausea, vomiting.  Outside imaging had revealed bladder calculus as well as left ureteral calculi.  He underwent cystoscopy with cystolitholapaxy yesterday with fragmentation/irrigation of bladder stone.  Ureteral orifices were unable to be visualized.  Follow-up CT study performed today revealed blood product within the bladder with scattered small calculi in the bladder and elevation of bladder by enlarged prostate gland.  There was potential distal right ureteral calculus at the right vesicoureteral junction, partially obstructing calculus at the left ureteropelvic junction and bilateral hydronephrosis.Request now received from urology for bilateral percutaneous nephrostomies.Risks and  benefits of procedure were discussed with the patient including, but not limited to, infection, bleeding, significant bleeding causing loss or decrease in renal function or damage to adjacent structures.   All of the patient's questions were answered, patient is agreeable to proceed.  Consent signed and in chart.  Procedure tentatively scheduled for today.   Thank you for this interesting consult.  I greatly enjoyed meeting Rameses Clearance CootsHarper and look forward to participating in their care.  A copy of this report was sent to the requesting provider on this date.  Electronically Signed: D. Jeananne RamaKevin Allred, PA-C 10/28/2017, 10:28 AM   I spent a total of 30 minutes  in face to face in clinical consultation, greater than 50% of which was counseling/coordinating care for the bilateral percutaneous nephrostomies

## 2017-10-28 NOTE — Progress Notes (Signed)
Patient resting comfortably at this time. Continue to monitor.

## 2017-10-28 NOTE — Progress Notes (Signed)
Dr Ronne BinningMcKenzie notified of CT results. MD states he will look at CT and follow up in needed.

## 2017-10-28 NOTE — Progress Notes (Signed)
Patient very anxious and asking continuously about his CBI not working. Even with education from multiple RNs and charge RN, patient still continues to not believe the CBI is working. The urine is light pink. Also, there is no order for SSI. Paged Dr. Wilkie AyePatrick McKenzie. New orders placed in Epic. Continue to monitor.

## 2017-10-28 NOTE — Sedation Documentation (Signed)
Patient is resting comfortably with eyes closed in NAD. 

## 2017-10-28 NOTE — Progress Notes (Signed)
Patient's belongings retrieved from PACU area. At bedside with patient to confirm all belongings were accounted for. All clothing including shoes present along with one black cell phone, one black phone charger, $351 dollars in cash, all credit cards, wallet, and belt. Pt acknowledges there is nothing missing. No other complaints/concerns.

## 2017-10-28 NOTE — Procedures (Signed)
Interventional Radiology Procedure Note  Procedure:  Image guided left nephrostomy, with 4F drain into inf/posterior calyx.  Attempt right PCN, with no hydro discovered.    Findings: Double-stick technique onto the existing right stone confirms no hydro, with the stone in an anterior inf calyx.  Initial stick unsafe for access.  Unable to distend the right system given rapid passage of contrast/urine through unobstructed distal ureter. Withdrew from right attempt at this time.   Complications: None  Recommendations:  - Left to gravity drain - routine wound care of left - routine wound care on the right - if required, another attempt on the right may be considered, with ancillary techniques possible  Signed,  Yvone NeuJaime S. Loreta AveWagner, DO

## 2017-10-29 LAB — MAGNESIUM: MAGNESIUM: 1.7 mg/dL (ref 1.7–2.4)

## 2017-10-29 LAB — GLUCOSE, CAPILLARY
GLUCOSE-CAPILLARY: 177 mg/dL — AB (ref 70–99)
Glucose-Capillary: 192 mg/dL — ABNORMAL HIGH (ref 70–99)
Glucose-Capillary: 134 mg/dL — ABNORMAL HIGH (ref 70–99)
Glucose-Capillary: 175 mg/dL — ABNORMAL HIGH (ref 70–99)

## 2017-10-29 LAB — BASIC METABOLIC PANEL
Anion gap: 10 (ref 5–15)
BUN: 19 mg/dL (ref 8–23)
CALCIUM: 8.8 mg/dL — AB (ref 8.9–10.3)
CO2: 25 mmol/L (ref 22–32)
CREATININE: 1.96 mg/dL — AB (ref 0.61–1.24)
Chloride: 108 mmol/L (ref 98–111)
GFR, EST AFRICAN AMERICAN: 40 mL/min — AB (ref >60)
GFR, EST NON AFRICAN AMERICAN: 35 mL/min — AB (ref >60)
Glucose, Bld: 210 mg/dL — ABNORMAL HIGH (ref 70–99)
Potassium: 3.9 mmol/L (ref 3.5–5.1)
SODIUM: 143 mmol/L (ref 135–145)

## 2017-10-29 LAB — CBC
HCT: 27.7 % — ABNORMAL LOW (ref 39.0–52.0)
Hemoglobin: 9 g/dL — ABNORMAL LOW (ref 13.0–17.0)
MCH: 28.4 pg (ref 26.0–34.0)
MCHC: 32.5 g/dL (ref 30.0–36.0)
MCV: 87.4 fL (ref 78.0–100.0)
PLATELETS: 176 10*3/uL (ref 150–400)
RBC: 3.17 MIL/uL — ABNORMAL LOW (ref 4.22–5.81)
RDW: 12.9 % (ref 11.5–15.5)
WBC: 12.7 10*3/uL — ABNORMAL HIGH (ref 4.0–10.5)

## 2017-10-29 MED ORDER — DEXTROSE 50 % IV SOLN
INTRAVENOUS | Status: AC
  Filled 2017-10-29: qty 50

## 2017-10-29 NOTE — Progress Notes (Signed)
Referring Physician(s): Ronne Binning Mardene Celeste  Supervising Physician: Malachy Moan  Patient Status:  [facility] - In-pt  Chief Complaint: None  Subjective:  Rreteral calculus s/p left nephrostomy tube placement 10/28/2017 with Dr. Loreta Ave. Patient awake and alert laying in bed with no complaints at this time. Left nephrostomy tube site c/d/i, collection bag with 300 mL of golden urine.   Allergies: Patient has no known allergies.  Medications: Prior to Admission medications   Medication Sig Start Date End Date Taking? Authorizing Provider  amLODipine (NORVASC) 5 MG tablet Take 5 mg by mouth daily. 09/14/17  Yes [provider]  aspirin EC 81 MG tablet Take 81 mg by mouth daily.   Yes [provider]  atorvastatin (LIPITOR) 80 MG tablet Take 80 mg by mouth daily. 09/14/17  Yes [provider]  EQ STOOL SOFTENER/LAXATIVE 8.6-50 MG tablet Take 1 tablet by mouth 2 (two) times daily. 09/14/17  Yes [provider]  insulin aspart protamine - aspart (NOVOLOG MIX 70/30 FLEXPEN) (70-30) 100 UNIT/ML FlexPen Inject 16 Units into the skin at bedtime.   Yes [provider]  lisinopril (PRINIVIL,ZESTRIL) 10 MG tablet Take 10 mg by mouth daily. 09/14/17  Yes [provider]  meclizine (ANTIVERT) 25 MG tablet Take 25 mg by mouth every 8 (eight) hours as needed for dizziness. 09/27/17  Yes [provider]  metoCLOPramide (REGLAN) 5 MG tablet Take 5 mg by mouth. Every 6 to 8 hours 10/12/17  Yes [provider]  ondansetron (ZOFRAN-ODT) 4 MG disintegrating tablet Take 4 mg by mouth every 4 (four) hours as needed for nausea. 09/27/17  Yes [provider]  oxycodone (OXY-IR) 5 MG capsule Take 5 mg by mouth every 6 (six) hours as needed for pain. 10/13/17  Yes [provider]     Vital Signs: BP 118/78   Pulse 95   Temp (!) 101.1 F (38.4 C) (Oral)   Resp (!) 23   Ht 6\' 2"  (1.88 m)   Wt 235 lb 14.3 oz (107 kg)   SpO2  97%   BMI 30.29 kg/m   Physical Exam  Constitutional: He is oriented to person, place, and time. He appears well-developed and well-nourished. No distress.  Pulmonary/Chest: Effort normal. No respiratory distress.  Genitourinary:  Genitourinary Comments: Left nephrostomy tube site without tenderness, erythema, or drainage; collection bag with 300 mL of golden urine.  Neurological: He is alert and oriented to person, place, and time.  Skin: Skin is warm and dry.  Psychiatric: He has a normal mood and affect. His behavior is normal. Judgment and thought content normal.  Nursing note and vitals reviewed.   Imaging: Dg Abd 1 View  Result Date: 10/26/2017 CLINICAL DATA:  Left-sided abdominal pain EXAM: ABDOMEN - 1 VIEW COMPARISON:  None. FINDINGS: Scattered large and small bowel gas is noted. There are calcifications overlying both kidneys consistent with nonobstructing renal stones. Two calcifications are noted to the left of the midline in the expected region of the proximal left ureter and may represent renal pelvic or proximal ureteral stones. An irregular bladder calculus is noted as well. No bony abnormality is seen. IMPRESSION: Bilateral renal calculi. Two of these stones on the left appear to lie in the expected region of the renal pelvis or proximal ureter. CT urography may be helpful for further evaluation. Electronically Signed   By: Alcide Clever M.D.   On: 10/26/2017 21:23   Dg C-arm 1-60 Min-no Report  Result Date: 10/27/2017 Fluoroscopy was utilized  by the requesting physician.  No radiographic interpretation.   Ct Renal Stone Study  Result Date: 10/28/2017 CLINICAL DATA:  Bladder calculus, LEFT renal calculi, post cystoscopy EXAM: CT ABDOMEN AND PELVIS WITHOUT CONTRAST TECHNIQUE: Multidetector CT imaging of the abdomen and pelvis was performed following the standard protocol without IV contrast. COMPARISON:  Plain film 10/26/2017 FINDINGS: Lower chest: 4 mm nodule in the RIGHT  middle lobe (image 21/3) Hepatobiliary: No focal hepatic lesion. No biliary duct dilatation. Gallbladder is normal. Common bile duct is normal. Pancreas: Pancreas is normal. No ductal dilatation. No pancreatic inflammation. Spleen: Normal spleen Adrenals/urinary tract: Adrenal glands normal. RIGHT kidney: bulky branching calcification lower pole of the RIGHT kidney measures 19 mm (image 45/2). There is mild hydroureter on the RIGHT. Potential obstructing calculus in the distal RIGHT ureter at the level of the vesicoureteral junction. Calculus measures 4 mm (image 78/2). LEFT kidney: 2 calculi in the LEFT kidney. One in the lower pole measuring 7 mm. One in the renal pelvis measuring 7 mm. Large calculus at the LEFT ureteropelvic junction measuring 8 mm (image 45/2). Mild hydronephrosis on the LEFT. No distal LEFT ureteral calculi. Foley catheter within the bladder. There is high-density material with locules of gas in the bladder consistent with blood products/hematoma. Scattered calcifications within the bladder. The prostate gland elevates the base the bladder. Stomach/Bowel: Stomach, small bowel, appendix, and cecum are normal. The colon and rectosigmoid colon are normal. Vascular/Lymphatic: Abdominal aorta is normal caliber. No periportal or retroperitoneal adenopathy. No pelvic adenopathy. Reproductive: Prostate gland is enlarged measuring 6.7 by 6.2 by 9.1 cm (volume = 200 cm^3) Other: Bilateral fat filled inguinal Musculoskeletal: Is IMPRESSION: 1. Postprocedural blood product within the bladder. Scattered small calculi in the bladder. Foley catheter within the bladder. Bladder elevated by enlarged prostate gland. 2. Potential distal RIGHT ureteral calculus at the RIGHT vesicoureteral junction. 3. Partially obstructing calculus at the LEFT ureteropelvic junction. 4. Bilateral nephrolithiasis. These results will be called to the ordering clinician or representative by the Radiologist Assistant, and  communication documented in the PACS or zVision Dashboard. Electronically Signed   By: Genevive Bi M.D.   On: 10/28/2017 09:24   Ir Nephrostogram Right Initial Placement  Result Date: 10/28/2017 INDICATION: 62 year old male with a history of bilateral nephrolithiasis EXAM: IMAGE GUIDED PLACEMENT OF LEFT PERCUTANEOUS NEPHROSTOMY IMAGE GUIDED NEEDLE ACCESS OF THE RIGHT COLLECTING SYSTEM FOR ANTEGRADE NEPHROSTOGRAM COMPARISON:  CT 10/28/2017 MEDICATIONS: 2 g Ancef; The antibiotic was administered in an appropriate time frame prior to skin puncture. ANESTHESIA/SEDATION: Fentanyl 100 mcg IV; Versed 2.0 mg IV Moderate Sedation Time:  53 minutes The patient was continuously monitored during the procedure by the interventional radiology nurse under my direct supervision. CONTRAST:  110 cc-administered into the collecting system(s) FLUOROSCOPY TIME:  Fluoroscopy Time: 10 minutes 30 seconds (347 mGy). COMPLICATIONS: None PROCEDURE: Informed written consent was obtained from the patient after a thorough discussion of the procedural risks, benefits and alternatives. All questions were addressed. Maximal Sterile Barrier Technique was utilized including caps, mask, sterile gowns, sterile gloves, sterile drape, hand hygiene and skin antiseptic. A timeout was performed prior to the initiation of the procedure. Patient positioned prone position on the fluoroscopy table. Left kidney was first addressed: Ultrasound survey of the left flank was performed with images stored and sent to PACs. The patient was then prepped and draped in the usual sterile fashion. 1% lidocaine was used to anesthetize the skin and subcutaneous tissues for local anesthesia. A Chiba needle was then used to  access a posterior inferior calyx with ultrasound guidance. With spontaneous urine returned through the needle, passage of an 018 micro wire into the collecting system was performed under fluoroscopy. A small incision was made with an 11 blade  scalpel, and the needle was removed from the wire. An Accustick system was then advanced over the wire into the collecting system under fluoroscopy. The metal stiffener and inner dilator were removed, and then a sample of fluid was aspirated through the 4 French outer sheath. J-wire was passed into the collecting system and the sheath removed. Ten French dilation of the soft tissues was performed. Using modified Seldinger technique, a 10 French pigtail catheter drain was placed over the Bentson wire. Wire and inner stiffener removed, and the pigtail was formed in the collecting system. Right kidney was then addressed: Ultrasound images were performed, with no hydronephrosis and no identification of a posterior/inferior calyx. Fluoroscopic, double stick approach was then elected. 1% lidocaine was used for local anesthesia. Using fluoroscopy guidance, a 21 gauge needle was advanced in AP direction through the right flank targeting the visualized radiopaque stone of the inferior right collecting system. This stone was known to be within an anterior calyx from the prior CT. Once the needle tip was in position, contrast was infused in attempt to opacify the right collecting system. Contrast rapidly exited the collecting system through the pelvis of the kidney and through the ureter, with no impeded flow at the ureteral vesicle junction. Significant volume of contrast was used in an attempt to identify a posterior inferior calyx. Gas was infused in the collecting system. One attempt was made with a second 21 gauge needle targeting the inferior posterior collecting system below the twelfth rib, however, a safe calyx was never identified confidently. Final images were acquired documenting no obstruction at the ureterovesical junction. At this time we withdrew from the right-sided attempt. Patient tolerated the procedure well and remained hemodynamically stable throughout. No complications were encountered and no significant  blood loss. Small amount of contrast confirmed position of the catheter. Patient tolerated the procedure well and remained hemodynamically stable throughout. No complications were encountered and no significant blood loss encountered IMPRESSION: Status post left sided percutaneous nephrostomy with placement of a 10 French drain. Status post attempt of right-sided nephrostomy, identifying no hydronephrosis and no obstruction at the distal right ureterovesical junction. There was inability to identify a safe access into a posterior inferior calyx using a double stick technique and ultrasound. Given the nonobstructive collecting system we withdrew at this time. Further attempts may be as needed. Signed, Yvone NeuJaime S. Reyne DumasWagner, DO, RPVI [facility] Electronically Signed   By: Gilmer MorJaime  Wagner D.O.   On: 10/28/2017 16:52   Ir Nephrostomy Placement Left  Result Date: 10/28/2017 INDICATION: 62 year old male with a history of bilateral nephrolithiasis EXAM: IMAGE GUIDED PLACEMENT OF LEFT PERCUTANEOUS NEPHROSTOMY IMAGE GUIDED NEEDLE ACCESS OF THE RIGHT COLLECTING SYSTEM FOR ANTEGRADE NEPHROSTOGRAM COMPARISON:  CT 10/28/2017 MEDICATIONS: 2 g Ancef; The antibiotic was administered in an appropriate time frame prior to skin puncture. ANESTHESIA/SEDATION: Fentanyl 100 mcg IV; Versed 2.0 mg IV Moderate Sedation Time:  53 minutes The patient was continuously monitored during the procedure by the interventional radiology nurse under my direct supervision. CONTRAST:  110 cc-administered into the collecting system(s) FLUOROSCOPY TIME:  Fluoroscopy Time: 10 minutes 30 seconds (347 mGy). COMPLICATIONS: None PROCEDURE: Informed written consent was obtained from the patient after a thorough discussion of the procedural risks, benefits and alternatives. All  questions were addressed. Maximal Sterile Barrier Technique was utilized including caps, mask, sterile gowns, sterile gloves,  sterile drape, hand hygiene and skin antiseptic. A timeout was performed prior to the initiation of the procedure. Patient positioned prone position on the fluoroscopy table. Left kidney was first addressed: Ultrasound survey of the left flank was performed with images stored and sent to PACs. The patient was then prepped and draped in the usual sterile fashion. 1% lidocaine was used to anesthetize the skin and subcutaneous tissues for local anesthesia. A Chiba needle was then used to access a posterior inferior calyx with ultrasound guidance. With spontaneous urine returned through the needle, passage of an 018 micro wire into the collecting system was performed under fluoroscopy. A small incision was made with an 11 blade scalpel, and the needle was removed from the wire. An Accustick system was then advanced over the wire into the collecting system under fluoroscopy. The metal stiffener and inner dilator were removed, and then a sample of fluid was aspirated through the 4 French outer sheath. J-wire was passed into the collecting system and the sheath removed. Ten French dilation of the soft tissues was performed. Using modified Seldinger technique, a 10 French pigtail catheter drain was placed over the Bentson wire. Wire and inner stiffener removed, and the pigtail was formed in the collecting system. Right kidney was then addressed: Ultrasound images were performed, with no hydronephrosis and no identification of a posterior/inferior calyx. Fluoroscopic, double stick approach was then elected. 1% lidocaine was used for local anesthesia. Using fluoroscopy guidance, a 21 gauge needle was advanced in AP direction through the right flank targeting the visualized radiopaque stone of the inferior right collecting system. This stone was known to be within an anterior calyx from the prior CT. Once the needle tip was in position, contrast was infused in attempt to opacify the right collecting system. Contrast rapidly  exited the collecting system through the pelvis of the kidney and through the ureter, with no impeded flow at the ureteral vesicle junction. Significant volume of contrast was used in an attempt to identify a posterior inferior calyx. Gas was infused in the collecting system. One attempt was made with a second 21 gauge needle targeting the inferior posterior collecting system below the twelfth rib, however, a safe calyx was never identified confidently. Final images were acquired documenting no obstruction at the ureterovesical junction. At this time we withdrew from the right-sided attempt. Patient tolerated the procedure well and remained hemodynamically stable throughout. No complications were encountered and no significant blood loss. Small amount of contrast confirmed position of the catheter. Patient tolerated the procedure well and remained hemodynamically stable throughout. No complications were encountered and no significant blood loss encountered IMPRESSION: Status post left sided percutaneous nephrostomy with placement of a 10 French drain. Status post attempt of right-sided nephrostomy, identifying no hydronephrosis and no obstruction at the distal right ureterovesical junction. There was inability to identify a safe access into a posterior inferior calyx using a double stick technique and ultrasound. Given the nonobstructive collecting system we withdrew at this time. Further attempts may be as needed. Signed, Yvone Neu. Reyne Dumas, RPVI [facility] Electronically Signed   By: Gilmer Mor D.O.   On: 10/28/2017 16:52    Labs:  CBC: Recent Labs    10/27/17 1339 10/28/17 1057 10/29/17 0636  WBC 5.6 17.2* 12.7*  HGB 11.3* 10.1* 9.0*  HCT 33.7* 30.5* 27.7*  PLT 176 189 176  COAGS: Recent Labs    10/27/17 2031  INR 1.09    BMP: Recent Labs    10/27/17 1339 10/28/17 1057 10/29/17 0636  NA 146* 140 143  K 3.9 4.6 3.9    CL 110 104 108  CO2 25 25 25   GLUCOSE 154* 219* 210*  BUN 17 22 19   CALCIUM 9.4 9.3 8.8*  CREATININE 2.01* 2.43* 1.96*  GFRNONAA 34* 27* 35*  GFRAA 39* 31* 40*    LIVER FUNCTION TESTS: Recent Labs    10/28/17 1057  BILITOT 0.8  AST 17  ALT 11  ALKPHOS 64  PROT 6.7  ALBUMIN 3.2*    Assessment and Plan:  Rreteral calculus s/p left nephrostomy tube placement 10/28/2017 with Dr. Loreta Ave. Left nephrostomy tube site stable, collection bag with 300 mL of golden urine. Continue to monitor output. Please call IR with questions/concerns.   Electronically Signed: Elwin Mocha, PA-C 10/29/2017, 3:44 PM   I spent a total of 15 Minutes at the the patient's bedside AND on the patient's hospital floor or unit, greater than 50% of which was counseling/coordinating care for ureteral calculus s/p left nephrostomy tube placement.

## 2017-10-29 NOTE — Progress Notes (Signed)
2 Days Post-Op Subjective: Patient reports mild infrequent bladder spasms. Patient is on slow drip CBI. He underwent left neph tube placement. Yesterday. He denies flank pain  Objective: Vital signs in last 24 hours: Temp:  [97.5 F (36.4 C)-101.1 F (38.4 C)] 101.1 F (38.4 C) (08/03 0800) Pulse Rate:  [62-137] 96 (08/03 0900) Resp:  [10-27] 18 (08/03 0900) BP: (95-164)/(55-108) 143/108 (08/03 0900) SpO2:  [92 %-100 %] 98 % (08/03 0900) Weight:  [107 kg (235 lb 14.3 oz)] 107 kg (235 lb 14.3 oz) (08/03 0500)  Intake/Output from previous day: 08/02 0701 - 08/03 0700 In: 27162.7 [P.O.:240; I.V.:1922.7] Out: 40981 [Urine:28275] Intake/Output this shift: Total I/O In: 240 [P.O.:240] Out: 1400 [Urine:1400]  Physical Exam:  General:alert, cooperative and appears stated age GI: soft, non tender, normal bowel sounds, no palpable masses, no organomegaly, no inguinal hernia Male genitalia: not done Extremities: extremities normal, atraumatic, no cyanosis or edema  Lab Results: Recent Labs    10/27/17 1339 10/28/17 1057 10/29/17 0636  HGB 11.3* 10.1* 9.0*  HCT 33.7* 30.5* 27.7*   BMET Recent Labs    10/28/17 1057 10/29/17 0636  NA 140 143  K 4.6 3.9  CL 104 108  CO2 25 25  GLUCOSE 219* 210*  BUN 22 19  CREATININE 2.43* 1.96*  CALCIUM 9.3 8.8*   Recent Labs    10/27/17 2031  INR 1.09   No results for input(s): LABURIN in the last 72 hours. Results for orders placed or performed during the hospital encounter of 10/27/17  MRSA PCR Screening     Status: None   Collection Time: 10/27/17  8:08 PM  Result Value Ref Range Status   MRSA by PCR NEGATIVE NEGATIVE Final    Comment:        The GeneXpert MRSA Assay (FDA approved for NASAL specimens only), is one component of a comprehensive MRSA colonization surveillance program. It is not intended to diagnose MRSA infection nor to guide or monitor treatment for MRSA infections. Performed at [facility], [address], [place], [place] [address]     Studies/Results: Dg C-arm 1-60 Min-no Report  Result Date: 10/27/2017 Fluoroscopy was utilized by the requesting physician.  No radiographic interpretation.   Ct Renal Stone Study  Result Date: 10/28/2017 CLINICAL DATA:  Bladder calculus, LEFT renal calculi, post cystoscopy EXAM: CT ABDOMEN AND PELVIS WITHOUT CONTRAST TECHNIQUE: Multidetector CT imaging of the abdomen and pelvis was performed following the standard protocol without IV contrast. COMPARISON:  Plain film 10/26/2017 FINDINGS: Lower chest: 4 mm nodule in the RIGHT middle lobe (image 21/3) Hepatobiliary: No focal hepatic lesion. No biliary duct dilatation. Gallbladder is normal. Common bile duct is normal. Pancreas: Pancreas is normal. No ductal dilatation. No pancreatic inflammation. Spleen: Normal spleen Adrenals/urinary tract: Adrenal glands normal. RIGHT kidney: bulky branching calcification lower pole of the RIGHT kidney measures 19 mm (image 45/2). There is mild hydroureter on the RIGHT. Potential obstructing calculus in the distal RIGHT ureter at the level of the vesicoureteral junction. Calculus measures 4 mm (image 78/2). LEFT kidney: 2 calculi in the LEFT kidney. One in the lower pole measuring 7 mm. One in the renal pelvis measuring 7 mm. Large calculus at the LEFT ureteropelvic junction measuring 8 mm (image 45/2). Mild hydronephrosis on the LEFT. No distal LEFT ureteral calculi. Foley catheter within the bladder. There is high-density material with locules of gas in the bladder consistent with blood products/hematoma. Scattered calcifications within the bladder. The prostate gland elevates the base  the bladder. Stomach/Bowel: Stomach, small bowel, appendix, and cecum are normal. The colon and rectosigmoid colon are normal. Vascular/Lymphatic: Abdominal aorta is normal caliber. No periportal or retroperitoneal adenopathy. No pelvic adenopathy. Reproductive: Prostate gland is  enlarged measuring 6.7 by 6.2 by 9.1 cm (volume = 200 cm^3) Other: Bilateral fat filled inguinal Musculoskeletal: Is IMPRESSION: 1. Postprocedural blood product within the bladder. Scattered small calculi in the bladder. Foley catheter within the bladder. Bladder elevated by enlarged prostate gland. 2. Potential distal RIGHT ureteral calculus at the RIGHT vesicoureteral junction. 3. Partially obstructing calculus at the LEFT ureteropelvic junction. 4. Bilateral nephrolithiasis. These results will be called to the ordering clinician or representative by the Radiologist Assistant, and communication documented in the PACS or zVision Dashboard. Electronically Signed   By: Genevive Bi M.D.   On: 10/28/2017 09:24   Ir Nephrostogram Right Initial Placement  Result Date: 10/28/2017 INDICATION: 62 year old male with a history of bilateral nephrolithiasis EXAM: IMAGE GUIDED PLACEMENT OF LEFT PERCUTANEOUS NEPHROSTOMY IMAGE GUIDED NEEDLE ACCESS OF THE RIGHT COLLECTING SYSTEM FOR ANTEGRADE NEPHROSTOGRAM COMPARISON:  CT 10/28/2017 MEDICATIONS: 2 g Ancef; The antibiotic was administered in an appropriate time frame prior to skin puncture. ANESTHESIA/SEDATION: Fentanyl 100 mcg IV; Versed 2.0 mg IV Moderate Sedation Time:  53 minutes The patient was continuously monitored during the procedure by the interventional radiology nurse under my direct supervision. CONTRAST:  110 cc-administered into the collecting system(s) FLUOROSCOPY TIME:  Fluoroscopy Time: 10 minutes 30 seconds (347 mGy). COMPLICATIONS: None PROCEDURE: Informed written consent was obtained from the patient after a thorough discussion of the procedural risks, benefits and alternatives. All questions were addressed. Maximal Sterile Barrier Technique was utilized including caps, mask, sterile gowns, sterile gloves, sterile drape, hand hygiene and skin antiseptic. A timeout was performed prior to the initiation of the procedure. Patient positioned prone position  on the fluoroscopy table. Left kidney was first addressed: Ultrasound survey of the left flank was performed with images stored and sent to PACs. The patient was then prepped and draped in the usual sterile fashion. 1% lidocaine was used to anesthetize the skin and subcutaneous tissues for local anesthesia. A Chiba needle was then used to access a posterior inferior calyx with ultrasound guidance. With spontaneous urine returned through the needle, passage of an 018 micro wire into the collecting system was performed under fluoroscopy. A small incision was made with an 11 blade scalpel, and the needle was removed from the wire. An Accustick system was then advanced over the wire into the collecting system under fluoroscopy. The metal stiffener and inner dilator were removed, and then a sample of fluid was aspirated through the 4 French outer sheath. J-wire was passed into the collecting system and the sheath removed. Ten French dilation of the soft tissues was performed. Using modified Seldinger technique, a 10 French pigtail catheter drain was placed over the Bentson wire. Wire and inner stiffener removed, and the pigtail was formed in the collecting system. Right kidney was then addressed: Ultrasound images were performed, with no hydronephrosis and no identification of a posterior/inferior calyx. Fluoroscopic, double stick approach was then elected. 1% lidocaine was used for local anesthesia. Using fluoroscopy guidance, a 21 gauge needle was advanced in AP direction through the right flank targeting the visualized radiopaque stone of the inferior right collecting system. This stone was known to be within an anterior calyx from the prior CT. Once the needle tip was in position, contrast was infused in attempt to opacify the right collecting system. Contrast  rapidly exited the collecting system through the pelvis of the kidney and through the ureter, with no impeded flow at the ureteral vesicle junction.  Significant volume of contrast was used in an attempt to identify a posterior inferior calyx. Gas was infused in the collecting system. One attempt was made with a second 21 gauge needle targeting the inferior posterior collecting system below the twelfth rib, however, a safe calyx was never identified confidently. Final images were acquired documenting no obstruction at the ureterovesical junction. At this time we withdrew from the right-sided attempt. Patient tolerated the procedure well and remained hemodynamically stable throughout. No complications were encountered and no significant blood loss. Small amount of contrast confirmed position of the catheter. Patient tolerated the procedure well and remained hemodynamically stable throughout. No complications were encountered and no significant blood loss encountered IMPRESSION: Status post left sided percutaneous nephrostomy with placement of a 10 French drain. Status post attempt of right-sided nephrostomy, identifying no hydronephrosis and no obstruction at the distal right ureterovesical junction. There was inability to identify a safe access into a posterior inferior calyx using a double stick technique and ultrasound. Given the nonobstructive collecting system we withdrew at this time. Further attempts may be as needed. Signed, Yvone Neu. Reyne Dumas, RPVI [facility] Electronically Signed   By: Gilmer Mor D.O.   On: 10/28/2017 16:52   Ir Nephrostomy Placement Left  Result Date: 10/28/2017 INDICATION: 62 year old male with a history of bilateral nephrolithiasis EXAM: IMAGE GUIDED PLACEMENT OF LEFT PERCUTANEOUS NEPHROSTOMY IMAGE GUIDED NEEDLE ACCESS OF THE RIGHT COLLECTING SYSTEM FOR ANTEGRADE NEPHROSTOGRAM COMPARISON:  CT 10/28/2017 MEDICATIONS: 2 g Ancef; The antibiotic was administered in an appropriate time frame prior to skin puncture. ANESTHESIA/SEDATION: Fentanyl 100 mcg IV; Versed 2.0 mg IV  Moderate Sedation Time:  53 minutes The patient was continuously monitored during the procedure by the interventional radiology nurse under my direct supervision. CONTRAST:  110 cc-administered into the collecting system(s) FLUOROSCOPY TIME:  Fluoroscopy Time: 10 minutes 30 seconds (347 mGy). COMPLICATIONS: None PROCEDURE: Informed written consent was obtained from the patient after a thorough discussion of the procedural risks, benefits and alternatives. All questions were addressed. Maximal Sterile Barrier Technique was utilized including caps, mask, sterile gowns, sterile gloves, sterile drape, hand hygiene and skin antiseptic. A timeout was performed prior to the initiation of the procedure. Patient positioned prone position on the fluoroscopy table. Left kidney was first addressed: Ultrasound survey of the left flank was performed with images stored and sent to PACs. The patient was then prepped and draped in the usual sterile fashion. 1% lidocaine was used to anesthetize the skin and subcutaneous tissues for local anesthesia. A Chiba needle was then used to access a posterior inferior calyx with ultrasound guidance. With spontaneous urine returned through the needle, passage of an 018 micro wire into the collecting system was performed under fluoroscopy. A small incision was made with an 11 blade scalpel, and the needle was removed from the wire. An Accustick system was then advanced over the wire into the collecting system under fluoroscopy. The metal stiffener and inner dilator were removed, and then a sample of fluid was aspirated through the 4 French outer sheath. J-wire was passed into the collecting system and the sheath removed. Ten French dilation of the soft tissues was performed. Using modified Seldinger technique, a 10 French pigtail catheter drain was placed over the Bentson wire. Wire and inner stiffener removed, and the pigtail was formed in the  collecting system. Right kidney was then addressed:  Ultrasound images were performed, with no hydronephrosis and no identification of a posterior/inferior calyx. Fluoroscopic, double stick approach was then elected. 1% lidocaine was used for local anesthesia. Using fluoroscopy guidance, a 21 gauge needle was advanced in AP direction through the right flank targeting the visualized radiopaque stone of the inferior right collecting system. This stone was known to be within an anterior calyx from the prior CT. Once the needle tip was in position, contrast was infused in attempt to opacify the right collecting system. Contrast rapidly exited the collecting system through the pelvis of the kidney and through the ureter, with no impeded flow at the ureteral vesicle junction. Significant volume of contrast was used in an attempt to identify a posterior inferior calyx. Gas was infused in the collecting system. One attempt was made with a second 21 gauge needle targeting the inferior posterior collecting system below the twelfth rib, however, a safe calyx was never identified confidently. Final images were acquired documenting no obstruction at the ureterovesical junction. At this time we withdrew from the right-sided attempt. Patient tolerated the procedure well and remained hemodynamically stable throughout. No complications were encountered and no significant blood loss. Small amount of contrast confirmed position of the catheter. Patient tolerated the procedure well and remained hemodynamically stable throughout. No complications were encountered and no significant blood loss encountered IMPRESSION: Status post left sided percutaneous nephrostomy with placement of a 10 French drain. Status post attempt of right-sided nephrostomy, identifying no hydronephrosis and no obstruction at the distal right ureterovesical junction. There was inability to identify a safe access into a posterior inferior calyx using a double stick technique and ultrasound. Given the nonobstructive  collecting system we withdrew at this time. Further attempts may be as needed. Signed, Yvone NeuJaime S. Reyne DumasWagner, DO, RPVI [facility] Electronically Signed   By: Gilmer MorJaime  Wagner D.O.   On: 10/28/2017 16:52    Assessment/Plan:  61yo with gross hematuria and ureteral calculi s/p L neph tube placement  1. Transfer to monitored floor bed.  2. Continue to Wean CBI 3. Likely discharge tomorrow   LOS: 2 days   Wilkie Ayeatrick McKenzie 10/29/2017, 10:33 AM

## 2017-10-29 NOTE — Progress Notes (Signed)
Paged MD regarding frequent PACs & PVCs. 2 hours previously the patient had bladder clots present and had to hand irrigate to remove clots. Foley remains patent. New orders for labs and EKG. Continue to monitor.

## 2017-10-30 LAB — CBC
HCT: 25.4 % — ABNORMAL LOW (ref 39.0–52.0)
Hemoglobin: 8.5 g/dL — ABNORMAL LOW (ref 13.0–17.0)
MCH: 29.1 pg (ref 26.0–34.0)
MCHC: 33.5 g/dL (ref 30.0–36.0)
MCV: 87 fL (ref 78.0–100.0)
Platelets: 168 10*3/uL (ref 150–400)
RBC: 2.92 MIL/uL — AB (ref 4.22–5.81)
RDW: 12.7 % (ref 11.5–15.5)
WBC: 9.3 10*3/uL (ref 4.0–10.5)

## 2017-10-30 LAB — GLUCOSE, CAPILLARY
GLUCOSE-CAPILLARY: 120 mg/dL — AB (ref 70–99)
Glucose-Capillary: 145 mg/dL — ABNORMAL HIGH (ref 70–99)
Glucose-Capillary: 93 mg/dL (ref 70–99)
Glucose-Capillary: 159 mg/dL — ABNORMAL HIGH (ref 70–99)

## 2017-10-30 NOTE — Progress Notes (Signed)
Report called to receiving Rn [id number]. Will transfer via bed. Patient with no complaints at the current time.

## 2017-10-30 NOTE — Progress Notes (Signed)
3 Days Post-Op Subjective: Patient reports mild infrequent bladder spasms. Patient is on slow drip CBI. He had mild left flank pain. Hemoglobin stable at 8.5  Objective: Vital signs in last 24 hours: Temp:  [98.3 F (36.8 C)-99.3 F (37.4 C)] 98.3 F (36.8 C) (08/04 0800) Pulse Rate:  [55-115] 87 (08/04 1100) Resp:  [15-26] 15 (08/04 1100) BP: (113-174)/(72-96) 152/85 (08/04 1100) SpO2:  [92 %-97 %] 95 % (08/04 1100)  Intake/Output from previous day: 08/03 0701 - 08/04 0700 In: 10970.7 [P.O.:720; I.V.:2250.7] Out: [id number] [Urine:11750] Intake/Output this shift: Total I/O In: 608.2 [P.O.:240; I.V.:368.2] Out: -   Physical Exam:  General:alert, cooperative and appears stated age GI: soft, non tender, normal bowel sounds, no palpable masses, no organomegaly, no inguinal hernia Male genitalia: not done Extremities: extremities normal, atraumatic, no cyanosis or edema  Lab Results: Recent Labs    10/28/17 1057 10/29/17 0636 10/30/17 1010  HGB 10.1* 9.0* 8.5*  HCT 30.5* 27.7* 25.4*   BMET Recent Labs    10/28/17 1057 10/29/17 0636  NA 140 143  K 4.6 3.9  CL 104 108  CO2 25 25  GLUCOSE 219* 210*  BUN 22 19  CREATININE 2.43* 1.96*  CALCIUM 9.3 8.8*   Recent Labs    10/27/17 2031  INR 1.09   No results for input(s): LABURIN in the last 72 hours. Results for orders placed or performed during the hospital encounter of 10/27/17  MRSA PCR Screening     Status: None   Collection Time: 10/27/17  8:08 PM  Result Value Ref Range Status   MRSA by PCR NEGATIVE NEGATIVE Final    Comment:        The GeneXpert MRSA Assay (FDA approved for NASAL specimens only), is one component of a comprehensive MRSA colonization surveillance program. It is not intended to diagnose MRSA infection nor to guide or monitor treatment for MRSA infections. Performed at [facility], [address] [place], [place] [address]     Studies/Results: Ir Nephrostogram  Right Initial Placement  Result Date: 10/28/2017 INDICATION: 62 year old male with a history of bilateral nephrolithiasis EXAM: IMAGE GUIDED PLACEMENT OF LEFT PERCUTANEOUS NEPHROSTOMY IMAGE GUIDED NEEDLE ACCESS OF THE RIGHT COLLECTING SYSTEM FOR ANTEGRADE NEPHROSTOGRAM COMPARISON:  CT 10/28/2017 MEDICATIONS: 2 g Ancef; The antibiotic was administered in an appropriate time frame prior to skin puncture. ANESTHESIA/SEDATION: Fentanyl 100 mcg IV; Versed 2.0 mg IV Moderate Sedation Time:  53 minutes The patient was continuously monitored during the procedure by the interventional radiology nurse under my direct supervision. CONTRAST:  110 cc-administered into the collecting system(s) FLUOROSCOPY TIME:  Fluoroscopy Time: 10 minutes 30 seconds (347 mGy). COMPLICATIONS: None PROCEDURE: Informed written consent was obtained from the patient after a thorough discussion of the procedural risks, benefits and alternatives. All questions were addressed. Maximal Sterile Barrier Technique was utilized including caps, mask, sterile gowns, sterile gloves, sterile drape, hand hygiene and skin antiseptic. A timeout was performed prior to the initiation of the procedure. Patient positioned prone position on the fluoroscopy table. Left kidney was first addressed: Ultrasound survey of the left flank was performed with images stored and sent to PACs. The patient was then prepped and draped in the usual sterile fashion. 1% lidocaine was used to anesthetize the skin and subcutaneous tissues for local anesthesia. A Chiba needle was then used to access a posterior inferior calyx with ultrasound guidance. With spontaneous urine returned through the needle, passage of an 018 micro wire into the collecting system was performed  under fluoroscopy. A small incision was made with an 11 blade scalpel, and the needle was removed from the wire. An Accustick system was then advanced over the wire into the collecting system under fluoroscopy. The metal  stiffener and inner dilator were removed, and then a sample of fluid was aspirated through the 4 French outer sheath. J-wire was passed into the collecting system and the sheath removed. Ten French dilation of the soft tissues was performed. Using modified Seldinger technique, a 10 French pigtail catheter drain was placed over the Bentson wire. Wire and inner stiffener removed, and the pigtail was formed in the collecting system. Right kidney was then addressed: Ultrasound images were performed, with no hydronephrosis and no identification of a posterior/inferior calyx. Fluoroscopic, double stick approach was then elected. 1% lidocaine was used for local anesthesia. Using fluoroscopy guidance, a 21 gauge needle was advanced in AP direction through the right flank targeting the visualized radiopaque stone of the inferior right collecting system. This stone was known to be within an anterior calyx from the prior CT. Once the needle tip was in position, contrast was infused in attempt to opacify the right collecting system. Contrast rapidly exited the collecting system through the pelvis of the kidney and through the ureter, with no impeded flow at the ureteral vesicle junction. Significant volume of contrast was used in an attempt to identify a posterior inferior calyx. Gas was infused in the collecting system. One attempt was made with a second 21 gauge needle targeting the inferior posterior collecting system below the twelfth rib, however, a safe calyx was never identified confidently. Final images were acquired documenting no obstruction at the ureterovesical junction. At this time we withdrew from the right-sided attempt. Patient tolerated the procedure well and remained hemodynamically stable throughout. No complications were encountered and no significant blood loss. Small amount of contrast confirmed position of the catheter. Patient tolerated the procedure well and remained hemodynamically stable throughout.  No complications were encountered and no significant blood loss encountered IMPRESSION: Status post left sided percutaneous nephrostomy with placement of a 10 French drain. Status post attempt of right-sided nephrostomy, identifying no hydronephrosis and no obstruction at the distal right ureterovesical junction. There was inability to identify a safe access into a posterior inferior calyx using a double stick technique and ultrasound. Given the nonobstructive collecting system we withdrew at this time. Further attempts may be as needed. Signed, Yvone Neu. Reyne Dumas, RPVI [facility] Radiology Electronically Signed   By: Gilmer Mor D.O.   On: 10/28/2017 16:52   Ir Nephrostomy Placement Left  Result Date: 10/28/2017 INDICATION: 62 year old male with a history of bilateral nephrolithiasis EXAM: IMAGE GUIDED PLACEMENT OF LEFT PERCUTANEOUS NEPHROSTOMY IMAGE GUIDED NEEDLE ACCESS OF THE RIGHT COLLECTING SYSTEM FOR ANTEGRADE NEPHROSTOGRAM COMPARISON:  CT 10/28/2017 MEDICATIONS: 2 g Ancef; The antibiotic was administered in an appropriate time frame prior to skin puncture. ANESTHESIA/SEDATION: Fentanyl 100 mcg IV; Versed 2.0 mg IV Moderate Sedation Time:  53 minutes The patient was continuously monitored during the procedure by the interventional radiology nurse under my direct supervision. CONTRAST:  110 cc-administered into the collecting system(s) FLUOROSCOPY TIME:  Fluoroscopy Time: 10 minutes 30 seconds (347 mGy). COMPLICATIONS: None PROCEDURE: Informed written consent was obtained from the patient after a thorough discussion of the procedural risks, benefits and alternatives. All questions were addressed. Maximal Sterile Barrier Technique was utilized including caps, mask, sterile gowns, sterile gloves, sterile drape, hand hygiene and skin antiseptic. A timeout was performed  prior to the initiation of the procedure. Patient positioned prone position on the  fluoroscopy table. Left kidney was first addressed: Ultrasound survey of the left flank was performed with images stored and sent to PACs. The patient was then prepped and draped in the usual sterile fashion. 1% lidocaine was used to anesthetize the skin and subcutaneous tissues for local anesthesia. A Chiba needle was then used to access a posterior inferior calyx with ultrasound guidance. With spontaneous urine returned through the needle, passage of an 018 micro wire into the collecting system was performed under fluoroscopy. A small incision was made with an 11 blade scalpel, and the needle was removed from the wire. An Accustick system was then advanced over the wire into the collecting system under fluoroscopy. The metal stiffener and inner dilator were removed, and then a sample of fluid was aspirated through the 4 French outer sheath. J-wire was passed into the collecting system and the sheath removed. Ten French dilation of the soft tissues was performed. Using modified Seldinger technique, a 10 French pigtail catheter drain was placed over the Bentson wire. Wire and inner stiffener removed, and the pigtail was formed in the collecting system. Right kidney was then addressed: Ultrasound images were performed, with no hydronephrosis and no identification of a posterior/inferior calyx. Fluoroscopic, double stick approach was then elected. 1% lidocaine was used for local anesthesia. Using fluoroscopy guidance, a 21 gauge needle was advanced in AP direction through the right flank targeting the visualized radiopaque stone of the inferior right collecting system. This stone was known to be within an anterior calyx from the prior CT. Once the needle tip was in position, contrast was infused in attempt to opacify the right collecting system. Contrast rapidly exited the collecting system through the pelvis of the kidney and through the ureter, with no impeded flow at the ureteral vesicle junction. Significant  volume of contrast was used in an attempt to identify a posterior inferior calyx. Gas was infused in the collecting system. One attempt was made with a second 21 gauge needle targeting the inferior posterior collecting system below the twelfth rib, however, a safe calyx was never identified confidently. Final images were acquired documenting no obstruction at the ureterovesical junction. At this time we withdrew from the right-sided attempt. Patient tolerated the procedure well and remained hemodynamically stable throughout. No complications were encountered and no significant blood loss. Small amount of contrast confirmed position of the catheter. Patient tolerated the procedure well and remained hemodynamically stable throughout. No complications were encountered and no significant blood loss encountered IMPRESSION: Status post left sided percutaneous nephrostomy with placement of a 10 French drain. Status post attempt of right-sided nephrostomy, identifying no hydronephrosis and no obstruction at the distal right ureterovesical junction. There was inability to identify a safe access into a posterior inferior calyx using a double stick technique and ultrasound. Given the nonobstructive collecting system we withdrew at this time. Further attempts may be as needed. Signed, Yvone Neu. Reyne Dumas, RPVI [facility] Radiology Electronically Signed   By: Gilmer Mor D.O.   On: 10/28/2017 16:52    Assessment/Plan:  61yo with gross hematuria and ureteral calculi s/p L neph tube placement  1. Transfer to monitored floor bed.  2. Continue to Wean CBI    LOS: 3 days   Wilkie Aye 10/30/2017, 11:19 AM

## 2017-10-31 ENCOUNTER — Encounter (HOSPITAL_COMMUNITY): Payer: Self-pay

## 2017-10-31 LAB — GLUCOSE, CAPILLARY
Glucose-Capillary: 163 mg/dL — ABNORMAL HIGH (ref 70–99)
Glucose-Capillary: 137 mg/dL — ABNORMAL HIGH (ref 70–99)
Glucose-Capillary: 134 mg/dL — ABNORMAL HIGH (ref 70–99)
Glucose-Capillary: 117 mg/dL — ABNORMAL HIGH (ref 70–99)

## 2017-10-31 MED ORDER — FINASTERIDE 5 MG PO TABS
5.0000 mg | ORAL_TABLET | Freq: Every day | ORAL | Status: DC
  Administered 2017-10-31 – 2017-11-01 (×2): 5 mg via ORAL
  Filled 2017-10-31 (×2): qty 1

## 2017-10-31 MED ORDER — POLYETHYLENE GLYCOL 3350 17 G PO PACK
17.0000 g | PACK | Freq: Every day | ORAL | Status: DC
  Administered 2017-11-01: 17 g via ORAL
  Filled 2017-10-31: qty 1

## 2017-10-31 NOTE — Progress Notes (Signed)
4 Days Post-Op Subjective: Patient reports mild infrequent bladder spasms but he bears down when he gets a spasms causing worsening hematuria. Patient is on slow drip CBI. He had mild left flank pain.   Objective: Vital signs in last 24 hours: Temp:  [97.9 F (36.6 C)-98.9 F (37.2 C)] 98.7 F (37.1 C) (08/05 2030) Pulse Rate:  [77-95] 80 (08/05 2030) Resp:  [16-20] 20 (08/05 2030) BP: (130-179)/(81-102) 179/97 (08/05 2030) SpO2:  [95 %-98 %] 95 % (08/05 2030)  Intake/Output from previous day: 08/04 0701 - 08/05 0700 In: 9850.8 [P.O.:300; I.V.:2250.8] Out: [id number] [Urine:11775] Intake/Output this shift: Total I/O In: 2060 [P.O.:60; Other:2000] Out: 3675 [Urine:3675]  Physical Exam:  General:alert, cooperative and appears stated age GI: soft, non tender, normal bowel sounds, no palpable masses, no organomegaly, no inguinal hernia Male genitalia: not done Extremities: extremities normal, atraumatic, no cyanosis or edema  Lab Results: Recent Labs    10/29/17 0636 10/30/17 1010  HGB 9.0* 8.5*  HCT 27.7* 25.4*   BMET Recent Labs    10/29/17 0636  NA 143  K 3.9  CL 108  CO2 25  GLUCOSE 210*  BUN 19  CREATININE 1.96*  CALCIUM 8.8*   No results for input(s): LABPT, INR in the last 72 hours. No results for input(s): LABURIN in the last 72 hours. Results for orders placed or performed during the hospital encounter of 10/27/17  MRSA PCR Screening     Status: None   Collection Time: 10/27/17  8:08 PM  Result Value Ref Range Status   MRSA by PCR NEGATIVE NEGATIVE Final    Comment:        The GeneXpert MRSA Assay (FDA approved for NASAL specimens only), is one component of a comprehensive MRSA colonization surveillance program. It is not intended to diagnose MRSA infection nor to guide or monitor treatment for MRSA infections. Performed at [facility], [address] [place], [place] [address]     Studies/Results: No results  found.  Assessment/Plan:  61yo with gross hematuria and ureteral calculi s/p L neph tube placement  1.  Continue to [employer] C[facility]. Patient instructed not to bear down and strain when he has a bladder spasm as this is exacerbating his hematuria. 2. Likely discharge tomorrow    LOS: 4 days   Wilkie Ayeatrick Tymere Depuy 10/31/2017, 9:43 PM

## 2017-10-31 NOTE — Progress Notes (Signed)
Tele notified this RN of this patient having multiple pauses with the biggest being 1.49 sec long. This RN assessed the patient and he was sleeping but easily aroused. VS done and stable. Dr Lucretia RoersWood notified whom was willing to help as urology is following. EKG done. Patient stated to this RN that he has sleep apnea and wears a CPAP at home. Refused to wear one now. Relayed to oncoming RN. Will continue to monitor patient

## 2017-10-31 NOTE — Progress Notes (Signed)
Referring Physician(s): Wilkie AyeMcKenzie, Patrick  Supervising Physician: Simonne ComeWatts, John  [facility]  Chief Complaint:  Kidney stones  Subjective: Patient doing okay; still having some occasional bladder spasms and irritation with CBI; mild left flank discomfort.  Denies nausea or vomiting.  Wanting to know when he will go home.   Allergies: Patient has no known allergies.  Medications: Prior to Admission medications   Medication Sig Start Date End Date Taking? Authorizing Provider  amLODipine (NORVASC) 5 MG tablet Take 5 mg by mouth daily. 09/14/17  Yes [provider]  aspirin EC 81 MG tablet Take 81 mg by mouth daily.   Yes [provider]  atorvastatin (LIPITOR) 80 MG tablet Take 80 mg by mouth daily. 09/14/17  Yes [provider]  EQ STOOL SOFTENER/LAXATIVE 8.6-50 MG tablet Take 1 tablet by mouth 2 (two) times daily. 09/14/17  Yes [provider]  insulin aspart protamine - aspart (NOVOLOG MIX 70/30 FLEXPEN) (70-30) 100 UNIT/ML FlexPen Inject 16 Units into the skin at bedtime.   Yes [provider]  lisinopril (PRINIVIL,ZESTRIL) 10 MG tablet Take 10 mg by mouth daily. 09/14/17  Yes [provider]  meclizine (ANTIVERT) 25 MG tablet Take 25 mg by mouth every 8 (eight) hours as needed for dizziness. 09/27/17  Yes [provider]  metoCLOPramide (REGLAN) 5 MG tablet Take 5 mg by mouth. Every 6 to 8 hours 10/12/17  Yes [provider]  ondansetron (ZOFRAN-ODT) 4 MG disintegrating tablet Take 4 mg by mouth every 4 (four) hours as needed for nausea. 09/27/17  Yes [provider]  oxycodone (OXY-IR) 5 MG capsule Take 5 mg by mouth every 6 (six) hours as needed for pain. 10/13/17  Yes [provider]     Vital Signs: BP (!) 158/102 (BP Location: Left Arm)   Pulse 89   Temp 97.9 F (36.6 C) (Oral)   Resp 16   Ht 6\' 2"  (1.88 m)   Wt 243 lb 2.7 oz (110.3 kg)   SpO2 97%   BMI 31.22 kg/m     Physical Exam left nephrostomy intact, insertion site okay, mildly tender, output 2 L yesterday, approximately 200 cc of tea colored urine today  Imaging: Dg C-arm 1-60 Min-no Report  Result Date: 10/27/2017 Fluoroscopy was utilized by the requesting physician.  No radiographic interpretation.   Ct Renal Stone Study  Result Date: 10/28/2017 CLINICAL DATA:  Bladder calculus, LEFT renal calculi, post cystoscopy EXAM: CT ABDOMEN AND PELVIS WITHOUT CONTRAST TECHNIQUE: Multidetector CT imaging of the abdomen and pelvis was performed following the standard protocol without IV contrast. COMPARISON:  Plain film 10/26/2017 FINDINGS: Lower chest: 4 mm nodule in the RIGHT middle lobe (image 21/3) Hepatobiliary: No focal hepatic lesion. No biliary duct dilatation. Gallbladder is normal. Common bile duct is normal. Pancreas: Pancreas is normal. No ductal dilatation. No pancreatic inflammation. Spleen: Normal spleen Adrenals/urinary tract: Adrenal glands normal. RIGHT kidney: bulky branching calcification lower pole of the RIGHT kidney measures 19 mm (image 45/2). There is mild hydroureter on the RIGHT. Potential obstructing calculus in the distal RIGHT ureter at the level of the vesicoureteral junction. Calculus measures 4 mm (image 78/2). LEFT kidney: 2 calculi in the LEFT kidney. One in the lower pole measuring 7 mm. One in the renal pelvis measuring 7 mm. Large calculus at the LEFT ureteropelvic junction measuring 8 mm (image 45/2). Mild hydronephrosis on the LEFT. No distal LEFT ureteral calculi. Foley catheter within the bladder. There is high-density  material with locules of gas in the bladder consistent with blood products/hematoma. Scattered calcifications within the bladder. The prostate gland elevates the base the bladder. Stomach/Bowel: Stomach, small bowel, appendix, and cecum are normal. The colon and rectosigmoid colon are normal. Vascular/Lymphatic: Abdominal aorta is normal caliber. No periportal or  retroperitoneal adenopathy. No pelvic adenopathy. Reproductive: Prostate gland is enlarged measuring 6.7 by 6.2 by 9.1 cm (volume = 200 cm^3) Other: Bilateral fat filled inguinal Musculoskeletal: Is IMPRESSION: 1. Postprocedural blood product within the bladder. Scattered small calculi in the bladder. Foley catheter within the bladder. Bladder elevated by enlarged prostate gland. 2. Potential distal RIGHT ureteral calculus at the RIGHT vesicoureteral junction. 3. Partially obstructing calculus at the LEFT ureteropelvic junction. 4. Bilateral nephrolithiasis. These results will be called to the ordering clinician or representative by the Radiologist Assistant, and communication documented in the PACS or zVision Dashboard. Electronically Signed   By: Genevive Bi M.D.   On: 10/28/2017 09:24   Ir Nephrostogram Right Initial Placement  Result Date: 10/28/2017 INDICATION: 62 year old male with a history of bilateral nephrolithiasis EXAM: IMAGE GUIDED PLACEMENT OF LEFT PERCUTANEOUS NEPHROSTOMY IMAGE GUIDED NEEDLE ACCESS OF THE RIGHT COLLECTING SYSTEM FOR ANTEGRADE NEPHROSTOGRAM COMPARISON:  CT 10/28/2017 MEDICATIONS: 2 g Ancef; The antibiotic was administered in an appropriate time frame prior to skin puncture. ANESTHESIA/SEDATION: Fentanyl 100 mcg IV; Versed 2.0 mg IV Moderate Sedation Time:  53 minutes The patient was continuously monitored during the procedure by the interventional radiology nurse under my direct supervision. CONTRAST:  110 cc-administered into the collecting system(s) FLUOROSCOPY TIME:  Fluoroscopy Time: 10 minutes 30 seconds (347 mGy). COMPLICATIONS: None PROCEDURE: Informed written consent was obtained from the patient after a thorough discussion of the procedural risks, benefits and alternatives. All questions were addressed. Maximal Sterile Barrier Technique was utilized including caps, mask, sterile gowns, sterile gloves, sterile drape, hand hygiene and skin antiseptic. A timeout was  performed prior to the initiation of the procedure. Patient positioned prone position on the fluoroscopy table. Left kidney was first addressed: Ultrasound survey of the left flank was performed with images stored and sent to PACs. The patient was then prepped and draped in the usual sterile fashion. 1% lidocaine was used to anesthetize the skin and subcutaneous tissues for local anesthesia. A Chiba needle was then used to access a posterior inferior calyx with ultrasound guidance. With spontaneous urine returned through the needle, passage of an 018 micro wire into the collecting system was performed under fluoroscopy. A small incision was made with an 11 blade scalpel, and the needle was removed from the wire. An Accustick system was then advanced over the wire into the collecting system under fluoroscopy. The metal stiffener and inner dilator were removed, and then a sample of fluid was aspirated through the 4 French outer sheath. J-wire was passed into the collecting system and the sheath removed. Ten French dilation of the soft tissues was performed. Using modified Seldinger technique, a 10 French pigtail catheter drain was placed over the Bentson wire. Wire and inner stiffener removed, and the pigtail was formed in the collecting system. Right kidney was then addressed: Ultrasound images were performed, with no hydronephrosis and no identification of a posterior/inferior calyx. Fluoroscopic, double stick approach was then elected. 1% lidocaine was used for local anesthesia. Using fluoroscopy guidance, a 21 gauge needle was advanced in AP direction through the right flank targeting the visualized radiopaque stone of the inferior right collecting system. This stone was known to be within an anterior calyx  from the prior CT. Once the needle tip was in position, contrast was infused in attempt to opacify the right collecting system. Contrast rapidly exited the collecting system through the pelvis of the kidney and  through the ureter, with no impeded flow at the ureteral vesicle junction. Significant volume of contrast was used in an attempt to identify a posterior inferior calyx. Gas was infused in the collecting system. One attempt was made with a second 21 gauge needle targeting the inferior posterior collecting system below the twelfth rib, however, a safe calyx was never identified confidently. Final images were acquired documenting no obstruction at the ureterovesical junction. At this time we withdrew from the right-sided attempt. Patient tolerated the procedure well and remained hemodynamically stable throughout. No complications were encountered and no significant blood loss. Small amount of contrast confirmed position of the catheter. Patient tolerated the procedure well and remained hemodynamically stable throughout. No complications were encountered and no significant blood loss encountered IMPRESSION: Status post left sided percutaneous nephrostomy with placement of a 10 French drain. Status post attempt of right-sided nephrostomy, identifying no hydronephrosis and no obstruction at the distal right ureterovesical junction. There was inability to identify a safe access into a posterior inferior calyx using a double stick technique and ultrasound. Given the nonobstructive collecting system we withdrew at this time. Further attempts may be as needed. Signed, Yvone Neu. Reyne Dumas, RPVI [facility] Radiology Electronically Signed   By: Gilmer Mor D.O.   On: 10/28/2017 16:52   Ir Nephrostomy Placement Left  Result Date: 10/28/2017 INDICATION: 62 year old male with a history of bilateral nephrolithiasis EXAM: IMAGE GUIDED PLACEMENT OF LEFT PERCUTANEOUS NEPHROSTOMY IMAGE GUIDED NEEDLE ACCESS OF THE RIGHT COLLECTING SYSTEM FOR ANTEGRADE NEPHROSTOGRAM COMPARISON:  CT 10/28/2017 MEDICATIONS: 2 g Ancef; The antibiotic was administered in an appropriate time frame prior to  skin puncture. ANESTHESIA/SEDATION: Fentanyl 100 mcg IV; Versed 2.0 mg IV Moderate Sedation Time:  53 minutes The patient was continuously monitored during the procedure by the interventional radiology nurse under my direct supervision. CONTRAST:  110 cc-administered into the collecting system(s) FLUOROSCOPY TIME:  Fluoroscopy Time: 10 minutes 30 seconds (347 mGy). COMPLICATIONS: None PROCEDURE: Informed written consent was obtained from the patient after a thorough discussion of the procedural risks, benefits and alternatives. All questions were addressed. Maximal Sterile Barrier Technique was utilized including caps, mask, sterile gowns, sterile gloves, sterile drape, hand hygiene and skin antiseptic. A timeout was performed prior to the initiation of the procedure. Patient positioned prone position on the fluoroscopy table. Left kidney was first addressed: Ultrasound survey of the left flank was performed with images stored and sent to PACs. The patient was then prepped and draped in the usual sterile fashion. 1% lidocaine was used to anesthetize the skin and subcutaneous tissues for local anesthesia. A Chiba needle was then used to access a posterior inferior calyx with ultrasound guidance. With spontaneous urine returned through the needle, passage of an 018 micro wire into the collecting system was performed under fluoroscopy. A small incision was made with an 11 blade scalpel, and the needle was removed from the wire. An Accustick system was then advanced over the wire into the collecting system under fluoroscopy. The metal stiffener and inner dilator were removed, and then a sample of fluid was aspirated through the 4 French outer sheath. J-wire was passed into the collecting system and the sheath removed. Ten French dilation of the soft tissues was performed. Using modified Seldinger technique, a  10 French pigtail catheter drain was placed over the Bentson wire. Wire and inner stiffener removed, and the  pigtail was formed in the collecting system. Right kidney was then addressed: Ultrasound images were performed, with no hydronephrosis and no identification of a posterior/inferior calyx. Fluoroscopic, double stick approach was then elected. 1% lidocaine was used for local anesthesia. Using fluoroscopy guidance, a 21 gauge needle was advanced in AP direction through the right flank targeting the visualized radiopaque stone of the inferior right collecting system. This stone was known to be within an anterior calyx from the prior CT. Once the needle tip was in position, contrast was infused in attempt to opacify the right collecting system. Contrast rapidly exited the collecting system through the pelvis of the kidney and through the ureter, with no impeded flow at the ureteral vesicle junction. Significant volume of contrast was used in an attempt to identify a posterior inferior calyx. Gas was infused in the collecting system. One attempt was made with a second 21 gauge needle targeting the inferior posterior collecting system below the twelfth rib, however, a safe calyx was never identified confidently. Final images were acquired documenting no obstruction at the ureterovesical junction. At this time we withdrew from the right-sided attempt. Patient tolerated the procedure well and remained hemodynamically stable throughout. No complications were encountered and no significant blood loss. Small amount of contrast confirmed position of the catheter. Patient tolerated the procedure well and remained hemodynamically stable throughout. No complications were encountered and no significant blood loss encountered IMPRESSION: Status post left sided percutaneous nephrostomy with placement of a 10 French drain. Status post attempt of right-sided nephrostomy, identifying no hydronephrosis and no obstruction at the distal right ureterovesical junction. There was inability to identify a safe access into a posterior inferior  calyx using a double stick technique and ultrasound. Given the nonobstructive collecting system we withdrew at this time. Further attempts may be as needed. Signed, Yvone Neu. Reyne Dumas, RPVI [facility] Radiology Electronically Signed   By: Gilmer Mor D.O.   On: 10/28/2017 16:52    Labs:  CBC: Recent Labs    10/27/17 1339 10/28/17 1057 10/29/17 0636 10/30/17 1010  WBC 5.6 17.2* 12.7* 9.3  HGB 11.3* 10.1* 9.0* 8.5*  HCT 33.7* 30.5* 27.7* 25.4*  PLT 176 189 176 168    COAGS: Recent Labs    10/27/17 2031  INR 1.09    BMP: Recent Labs    10/27/17 1339 10/28/17 1057 10/29/17 0636  NA 146* 140 143  K 3.9 4.6 3.9  CL 110 104 108  CO2 25 25 25   GLUCOSE 154* 219* 210*  BUN 17 22 19   CALCIUM 9.4 9.3 8.8*  CREATININE 2.01* 2.43* 1.96*  GFRNONAA 34* 27* 35*  GFRAA 39* 31* 40*    LIVER FUNCTION TESTS: Recent Labs    10/28/17 1057  BILITOT 0.8  AST 17  ALT 11  ALKPHOS 64  PROT 6.7  ALBUMIN 3.2*    Assessment and Plan: History of bilateral nephrolithiasis and left percutaneous nephrostomy on 10/28/2017; right kidney nonobstructed and with no significant hydronephrosis on 8/2 exam.  Afebrile, no new labs, nephrostomy functioning okay; plans as per Dr. Ronne Binning.   Electronically Signed: D. Jeananne Rama, PA-C 10/31/2017, 1:22 PM   I spent a total of 15 minutes at the the patient's bedside AND on the patient's hospital floor or unit, greater than 50% of which was counseling/coordinating care for left nephrostomy    Patient ID: Dillon Phillips, male   DOB: 1956-02-07, 62 y.o.   MRN: [id number]

## 2017-11-01 LAB — CBC
HEMATOCRIT: 26.3 % — AB (ref 39.0–52.0)
Hemoglobin: 8.9 g/dL — ABNORMAL LOW (ref 13.0–17.0)
MCH: 29 pg (ref 26.0–34.0)
MCHC: 33.8 g/dL (ref 30.0–36.0)
MCV: 85.7 fL (ref 78.0–100.0)
PLATELETS: 228 10*3/uL (ref 150–400)
RBC: 3.07 MIL/uL — AB (ref 4.22–5.81)
RDW: 12.7 % (ref 11.5–15.5)
WBC: 9.5 10*3/uL (ref 4.0–10.5)

## 2017-11-01 LAB — GLUCOSE, CAPILLARY
Glucose-Capillary: 131 mg/dL — ABNORMAL HIGH (ref 70–99)
Glucose-Capillary: 142 mg/dL — ABNORMAL HIGH (ref 70–99)

## 2017-11-01 MED ORDER — HYDRALAZINE HCL 20 MG/ML IJ SOLN
10.0000 mg | Freq: Once | INTRAMUSCULAR | Status: AC
  Administered 2017-11-01: 10 mg via INTRAVENOUS
  Filled 2017-11-01: qty 1

## 2017-11-01 MED ORDER — FINASTERIDE 5 MG PO TABS: 5.0000 mg | ORAL_TABLET | Freq: Every day | ORAL | 3 refills | Status: AC

## 2017-11-01 MED ORDER — OXYCODONE HCL 5 MG PO CAPS: 5.0000 mg | ORAL_CAPSULE | Freq: Four times a day (QID) | ORAL | 0 refills | Status: DC | PRN

## 2017-11-01 NOTE — Progress Notes (Signed)
Chief Complaint: Patient was seen today for (L)PCN   Supervising Physician: Simonne Come  Patient Status: [facility] - In-pt  Subjective: S/p (L)PCN on 8/2. Feeling well. No pain Foley was removed, pt voiding, anxious to go home.  Objective: Physical Exam: BP (!) 133/98 (BP Location: Left Arm)   Pulse 94   Temp 99 F (37.2 C) (Oral)   Resp 17   Ht 6\' 2"  (1.88 m)   Wt 243 lb 2.7 oz (110.3 kg)   SpO2 95%   BMI 31.22 kg/m  (L)PCN intact, c/d/i Bag just emptied of clear urine   Current Facility-Administered Medications:  .  0.9 %  sodium chloride infusion, , Intravenous, Continuous, McKenzie, Mardene Celeste, MD, Last Rate: 100 mL/hr at 11/01/17 0418 .  acetaminophen (TYLENOL) tablet 650 mg, 650 mg, Oral, Q4H PRN, Malen Gauze, MD, 650 mg at 10/29/17 0933 .  amLODipine (NORVASC) tablet 5 mg, 5 mg, Oral, Daily, McKenzie, Mardene Celeste, MD, 5 mg at 11/01/17 0949 .  atorvastatin (LIPITOR) tablet 80 mg, 80 mg, Oral, Daily, McKenzie, Mardene Celeste, MD, 80 mg at 11/01/17 0949 .  diphenhydrAMINE (BENADRYL) injection 12.5-25 mg, 12.5-25 mg, Intravenous, Q6H PRN **OR** diphenhydrAMINE (BENADRYL) 12.5 MG/5ML elixir 12.5-25 mg, 12.5-25 mg, Oral, Q6H PRN, McKenzie, Mardene Celeste, MD .  finasteride (PROSCAR) tablet 5 mg, 5 mg, Oral, Daily, McKenzie, Mardene Celeste, MD, 5 mg at 11/01/17 0949 .  HYDROmorphone (DILAUDID) injection 1-2 mg, 1-2 mg, Intravenous, Q2H PRN, Ronne Binning Mardene Celeste, MD, 2 mg at 10/29/17 0043 .  hyoscyamine (LEVSIN SL) SL tablet 0.125 mg, 0.125 mg, Sublingual, Q4H PRN, McKenzie, Mardene Celeste, MD .  insulin aspart (novoLOG) injection 0-15 Units, 0-15 Units, Subcutaneous, TID WC, McKenzie, Mardene Celeste, MD, 2 Units at 11/01/17 [phone] .  metoCLOPramide (REGLAN) tablet 5 mg, 5 mg, Oral, TID AC & HS, McKenzie, Mardene Celeste, MD, 5 mg at 11/01/17 0835 .  ondansetron (ZOFRAN) injection 4 mg, 4 mg, Intravenous, Q4H PRN, Ronne Binning Mardene Celeste, MD, 4 mg at 10/30/17 1304 .  opium-belladonna (B&O SUPPRETTES)  16.2-60 MG suppository 1 suppository, 1 suppository, Rectal, Q6H PRN, Ronne Binning Mardene Celeste, MD, 1 suppository at 10/27/17 2357 .  oxybutynin (DITROPAN) tablet 5 mg, 5 mg, Oral, Q8H PRN, McKenzie, Mardene Celeste, MD, 5 mg at 11/01/17 0110 .  oxyCODONE-acetaminophen (PERCOCET/ROXICET) 5-325 MG per tablet 1-2 tablet, 1-2 tablet, Oral, Q4H PRN, Ronne Binning Mardene Celeste, MD, 1 tablet at 11/01/17 0110 .  polyethylene glycol (MIRALAX / GLYCOLAX) packet 17 g, 17 g, Oral, Daily, McKenzie, Mardene Celeste, MD, 17 g at 11/01/17 0949 .  Continuous Bladder Irrigation, , , Until Discontinued **AND** sodium chloride irrigation 0.9 % 3,000 mL, 3,000 mL, Irrigation, Continuous, McKenzie, Mardene Celeste, MD, 3,000 mL at 11/01/17 0310 .  zolpidem (AMBIEN) tablet 5 mg, 5 mg, Oral, QHS PRN,MR X 1, McKenzie, Mardene Celeste, MD, 5 mg at 10/29/17 2207  Labs: CBC Recent Labs    10/30/17 1010 11/01/17 0858  WBC 9.3 9.5  HGB 8.5* 8.9*  HCT 25.4* 26.3*  PLT 168 228   BMET No results for input(s): NA, K, CL, CO2, GLUCOSE, BUN, CREATININE, CALCIUM in the last 72 hours. LFT No results for input(s): PROT, ALBUMIN, AST, ALT, ALKPHOS, BILITOT, BILIDIR, IBILI, LIPASE in the last 72 hours. PT/INR No results for input(s): LABPROT, INR in the last 72 hours.   Studies/Results: No results found.  Assessment/Plan: S/p (L)PCN for ureteral calculus Poss DC home later today per Urology notes.     LOS: 5 days  I spent a total of 15 minutes in face to face in clinical consultation, greater than 50% of which was counseling/coordinating care for [facility](L)PCN  Brayton ElBRUNING, Erion Hermans PA-C 11/01/2017 1:06 PM

## 2017-11-01 NOTE — Discharge Instructions (Signed)
Percutaneous Nephrostomy, Care After  This sheet gives you information about how to care for yourself after your procedure. Your health care provider may also give you more specific instructions. If you have problems or questions, contact your health care provider.  What can I expect after the procedure?  After the procedure, it is common to have:  · Some soreness where the nephrostomy tube was inserted (tube insertion site).  · Blood-tinged drainage from the nephrostomy tube for the first 24 hours.    Follow these instructions at home:  Activity  · Return to your normal activities as told by your health care provider. Ask your health care provider what activities are safe for you.  · Avoid activities that may cause the nephrostomy tubing to bend.  · Do not take baths, swim, or use a hot tub until your health care provider approves. Ask your health care provider if you can take showers. Cover the nephrostomy tube dressing with a watertight covering when you take a shower.  · Do not drive for 24 hours if you were given a medicine to help you relax (sedative).  Care of the tube insertion site  · Follow instructions from your health care provider about how to take care of your tube insertion site. Make sure you:  ? Wash your hands with soap and water before you change your bandage (dressing). If soap and water are not available, use hand sanitizer.  ? Change your dressing as told by your health care provider. Be careful not to pull on the tube while removing the dressing.  ? When you change the dressing, wash the skin around the tube, rinse well, and pat the skin dry.  · Check the tube insertion area every day for signs of infection. Check for:  ? More redness, swelling, or pain.  ? More fluid or blood.  ? Warmth.  ? Pus or a bad smell.  Care of the nephrostomy tube and drainage bag  · Always keep the tubing, the leg bag, or the bedside drainage bags below the level of the kidney so that your urine drains  freely.  · When connecting your nephrostomy tube to a drainage bag, make sure that there are no kinks in the tubing and that your urine is draining freely. You may want to use an elastic bandage to wrap any exposed tubing that goes from the nephrostomy tube to any of the connecting tubes.  · At night, you may want to connect your nephrostomy tube or the leg bag to a larger bedside drainage bag.  · Follow instructions from your health care provider about how to empty or change the drainage bag.  · Empty the drainage bag when it becomes ? full.  · Replace the drainage bag and any extension tubing that is connected to your nephrostomy tube every 3 weeks or as often as told by your health care provider. Your health care provider will explain how to change the drainage bag and extension tubing.  General instructions  · Take over-the-counter and prescription medicines only as told by your health care provider.  · Keep all follow-up visits as told by your health care provider. This is important.  Contact a health care provider if:  · You have problems with any of the valves or tubing.  · You have persistent pain or soreness in your back.  · You have more redness, swelling, or pain around your tube insertion site.  · You have more fluid or blood coming from   your tube insertion site.  · Your tube insertion site feels warm to the touch.  · You have pus or a bad smell coming from your tube insertion site.  · You have increased urine output or you feel burning when urinating.  Get help right away if:  · You have pain in your abdomen during the first week.  · You have chest pain or have trouble breathing.  · You have a new appearance of blood in your urine.  · You have a fever or chills.  · You have back pain that is not relieved by your medicine.  · You have decreased urine output.  · Your nephrostomy tube comes out.  This information is not intended to replace advice given to you by your health care provider. Make sure you  discuss any questions you have with your health care provider.  Document Released: 11/06/2003 Document Revised: 12/26/2015 Document Reviewed: 12/26/2015  Elsevier Interactive Patient Education © 2018 [employer]

## 2017-11-01 NOTE — Progress Notes (Signed)
VS @ 0444 = BP 182/107 HR 87. No PRN meds for BP parameters ordered. On call MD paged to notify of elevated BP. Give verbal order with read back for one time dose of Hydralazine 10mg  IV. Will recheck VS 1hr after administration and repage MD if not trending down.

## 2017-11-01 NOTE — Progress Notes (Addendum)
Patient remains a&ox4, ambulatory. Discharge instructions reviewed,  Questions, concerns denied. Hard copy  prescriptions provided

## 2017-11-01 NOTE — Progress Notes (Signed)
Nephrostomy site dressing changed.

## 2017-11-01 NOTE — Progress Notes (Signed)
Patient voided small  Amount 25 cc with clots. Will continue to monitor. Pt encouraged to not strain with voiding.

## 2017-11-17 NOTE — Discharge Summary (Signed)
Physician Discharge Summary  Patient ID: Dillon Phillips MRN: [id number] DOB/AGE: 1955/03/30 62 y.o.  Admit date: 10/27/2017 Discharge date: 11/01/2017  Admission Diagnoses: Bladder calculus, ureteral calculus Discharge Diagnoses:  Active Problems:   Ureteral calculus   Status post cystoscopy   Discharged Condition: good  Hospital Course: The patient tolerated the procedure well and was transferred to the ICU on IV pain meds, IV fluid. On POD#1 he had a left nephrostomy tube placed for an obstructing ureteral calculus. On POD#2 pt was started on clear liquid diet and they ambulated in the halls. On POD#4 his CBI was discontinued. On POD#5 the foley was removed, the patient was able to urinate, IVFs were discontinued, and the patient passed flatus. Prior to discharge the pt was tolerating a regular diet, pain was controlled on PO pain meds, they were ambulating without difficulty, and they had normal bowel function.  Consults: Interventional radiology  Significant Diagnostic Studies: none  Treatments: surgery: cystolithaopaxy and left nephrostomy tube placement  Discharge Exam: Blood pressure (!) 133/98, pulse 94, temperature 99 F (37.2 C), temperature source Oral, resp. rate 17, height 6\' 2"  (1.88 m), weight 110.3 kg, SpO2 95 %. General appearance: alert, cooperative and appears stated age Head: Normocephalic, without obvious abnormality, atraumatic Nose: Nares normal. Septum midline. Mucosa normal. No drainage or sinus tenderness. Back: symmetric, no curvature. ROM normal. No CVA tenderness. Resp: clear to auscultation bilaterally Cardio: regular rate and rhythm, S1, S2 normal, no murmur, click, rub or gallop GI: soft, non-tender; bowel sounds normal; no masses,  no organomegaly Extremities: extremities normal, atraumatic, no cyanosis or edema Neurologic: Grossly normal  Disposition:    Allergies as of 11/01/2017   No Known Allergies     Medication List    TAKE these  medications   amLODipine 5 MG tablet Commonly known as:  NORVASC Take 5 mg by mouth daily.   aspirin EC 81 MG tablet Take 81 mg by mouth daily.   atorvastatin 80 MG tablet Commonly known as:  LIPITOR Take 80 mg by mouth daily.   EQ STOOL SOFTENER/LAXATIVE 8.6-50 MG tablet Generic drug:  senna-docusate Take 1 tablet by mouth 2 (two) times daily.   finasteride 5 MG tablet Commonly known as:  PROSCAR Take 1 tablet (5 mg total) by mouth daily.   lisinopril 10 MG tablet Commonly known as:  PRINIVIL,ZESTRIL Take 10 mg by mouth daily.   meclizine 25 MG tablet Commonly known as:  ANTIVERT Take 25 mg by mouth every 8 (eight) hours as needed for dizziness.   metoCLOPramide 5 MG tablet Commonly known as:  REGLAN Take 5 mg by mouth. Every 6 to 8 hours   NOVOLOG MIX 70/30 FLEXPEN (70-30) 100 UNIT/ML FlexPen Generic drug:  insulin aspart protamine - aspart Inject 16 Units into the skin at bedtime.   ondansetron 4 MG disintegrating tablet Commonly known as:  ZOFRAN-ODT Take 4 mg by mouth every 4 (four) hours as needed for nausea.   oxycodone 5 MG capsule Commonly known as:  OXY-IR Take 1 capsule (5 mg total) by mouth every 6 (six) hours as needed for pain.      Follow-up Information    Mertice Uffelman, Mardene CelestePatrick L, MD. Call in 1 week(s).   Specialty:  Urology Contact information: [address] [place] [address] [phone]           Signed: Wilkie Ayeatrick Qaadir Kent 11/17/2017, 6:47 AM

## 2017-12-23 ENCOUNTER — Other Ambulatory Visit: Payer: Self-pay | Admitting: Urology

## 2017-12-23 ENCOUNTER — Ambulatory Visit (HOSPITAL_COMMUNITY)
Admission: RE | Admit: 2017-12-23 | Discharge: 2017-12-23 | Disposition: A | Discharge status: Home / Self Care | Payer: No Typology Code available for payment source | Source: Ambulatory Visit | Attending: Urology | Admitting: Urology

## 2017-12-23 ENCOUNTER — Encounter (HOSPITAL_COMMUNITY): Payer: Self-pay | Admitting: Interventional Radiology

## 2017-12-23 DIAGNOSIS — N200 Calculus of kidney: Secondary | ICD-10-CM

## 2017-12-23 DIAGNOSIS — Z87442 Personal history of urinary calculi: Secondary | ICD-10-CM | POA: Diagnosis not present

## 2017-12-23 DIAGNOSIS — Z436 Encounter for attention to other artificial openings of urinary tract: Secondary | ICD-10-CM | POA: Insufficient documentation

## 2017-12-23 HISTORY — PX: IR NEPHROSTOMY EXCHANGE LEFT: IMG6069

## 2017-12-23 MED ORDER — IOPAMIDOL (ISOVUE-300) INJECTION 61%
INTRAVENOUS | Status: AC
  Filled 2017-12-23: qty 50

## 2017-12-23 MED ORDER — LIDOCAINE HCL 1 % IJ SOLN
INTRAMUSCULAR | Status: AC
  Filled 2017-12-23: qty 20

## 2017-12-23 MED ORDER — IOPAMIDOL (ISOVUE-300) INJECTION 61%
50.0000 mL | Freq: Once | INTRAVENOUS | Status: AC | PRN
  Administered 2017-12-23: 10 mL

## 2017-12-26 ENCOUNTER — Other Ambulatory Visit: Payer: Self-pay | Admitting: Urology

## 2018-01-03 NOTE — Patient Instructions (Addendum)
Dillon Phillips  01/03/2018   Your procedure is scheduled on: 01-12-18    Report to [facility] Main  Entrance              Report to admitting at     1200 PM    Call this number if you have problems the morning of surgery [phone]    Remember: Do not eat food  :After Midnight.YOU MAY HAVE CLEAR LIQUIDS UNTIL 0800 AM THEN NOTHING BY MOUTH    CLEAR LIQUID DIET   Foods Allowed                                                                     Foods Excluded  Coffee and tea, regular and decaf                             liquids that you cannot  Plain Jell-O in any flavor                                             see through such as: Fruit ices (not with fruit pulp)                                     milk, soups, orange juice  Iced Popsicles                                    All solid food Carbonated beverages, regular and diet                                    Cranberry, grape and apple juices Sports drinks like Gatorade Lightly seasoned clear broth or consume(fat free) Sugar, honey syrup   _____________________________________________________________________    BRUSH YOUR TEETH MORNING OF SURGERY AND RINSE YOUR MOUTH OUT, NO CHEWING GUM CANDY OR MINTS.     Take these medicines the morning of surgery with A SIP OF WATER: FINASTERIDE DO NOT TAKE ANY DIABETIC MEDICATIONS DAY OF YOUR SURGERY                               You may not have any metal on your body including hair pins and              piercings  Do not wear jewelry,  lotions, powders or perfumes, deodorant                      Men may shave face and neck.   Do not bring valuables to the hospital. [facility] IS NOT             RESPONSIBLE   FOR VALUABLES.  Contacts, dentures or bridgework may not be worn  into surgery.  Leave suitcase in the car. After surgery it may be brought to your room.                Please read over the following fact sheets you were  given: _____________________________________________________________________           [facility] - Preparing for Surgery Before surgery, you can play an important role.  Because skin is not sterile, your skin needs to be as free of germs as possible.  You can reduce the number of germs on your skin by washing with CHG (chlorahexidine gluconate) soap before surgery.  CHG is an antiseptic cleaner which kills germs and bonds with the skin to continue killing germs even after washing. Please DO NOT use if you have an allergy to CHG or antibacterial soaps.  If your skin becomes reddened/irritated stop using the CHG and inform your nurse when you arrive at Short Stay. Do not shave (including legs and underarms) for at least 48 hours prior to the first CHG shower.  You may shave your face/neck. Please follow these instructions carefully:  1.  Shower with CHG Soap the night before surgery and the  morning of Surgery.  2.  If you choose to wash your hair, wash your hair first as usual with your  normal  shampoo.  3.  After you shampoo, rinse your hair and body thoroughly to remove the  shampoo.                           4.  Use CHG as you would any other liquid soap.  You can apply chg directly  to the skin and wash                       Gently with a scrungie or clean washcloth.  5.  Apply the CHG Soap to your body ONLY FROM THE NECK DOWN.   Do not use on face/ open                           Wound or open sores. Avoid contact with eyes, ears mouth and genitals (private parts).                       Wash face,  Genitals (private parts) with your normal soap.             6.  Wash thoroughly, paying special attention to the area where your surgery  will be performed.  7.  Thoroughly rinse your body with warm water from the neck down.  8.  DO NOT shower/wash with your normal soap after using and rinsing off  the CHG Soap.                9.  Pat yourself dry with a clean towel.            10.  Wear clean  pajamas.            11.  Place clean sheets on your bed the night of your first shower and do not  sleep with pets. Day of Surgery : Do not apply any lotions/deodorants the morning of surgery.  Please wear clean clothes to the hospital/surgery center.  FAILURE TO FOLLOW THESE INSTRUCTIONS MAY RESULT IN THE CANCELLATION OF YOUR SURGERY PATIENT SIGNATURE_________________________________  NURSE SIGNATURE__________________________________  ________________________________________________________________________ How to Manage Your  Diabetes Before and After Surgery  Why is it important to control my blood sugar before and after surgery? . Improving blood sugar levels before and after surgery helps healing and can limit problems. . A way of improving blood sugar control is eating a healthy diet by: o  Eating less sugar and carbohydrates o  Increasing activity/exercise o  Talking with your doctor about reaching your blood sugar goals . High blood sugars (greater than 180 mg/dL) can raise your risk of infections and slow your recovery, so you will need to focus on controlling your diabetes during the weeks before surgery. . Make sure that the doctor who takes care of your diabetes knows about your planned surgery including the date and location.  How do I manage my blood sugar before surgery? . Check your blood sugar at least 4 times a day, starting 2 days before surgery, to make sure that the level is not too high or low. o Check your blood sugar the morning of your surgery when you wake up and every 2 hours until you get to the Short Stay unit. . If your blood sugar is less than 70 mg/dL, you will need to treat for low blood sugar: o Do not take insulin. o Treat a low blood sugar (less than 70 mg/dL) with  cup of clear juice (cranberry or apple), 4 glucose tablets, OR glucose gel. o Recheck blood sugar in 15 minutes after treatment (to make sure it is greater than 70 mg/dL). If your blood  sugar is not greater than 70 mg/dL on recheck, call [phone] for further instructions. . Report your blood sugar to the short stay nurse when you get to Short Stay.  . If you are admitted to the hospital after surgery: o Your blood sugar will be checked by the staff and you will probably be given insulin after surgery (instead of oral diabetes medicines) to make sure you have good blood sugar levels. o The goal for blood sugar control after surgery is 80-180 mg/dL.   WHAT DO I DO ABOUT MY DIABETES MEDICATION?  Marland Kitchen Do not take oral diabetes medicines (pills) the morning of surgery.  . THE DAY BEFORE SURGERY, take 70% of your insulin dose am and pm dose          . THE MORNING OF SURGERY, take   0 units of    insulin.  . The day of surgery, do not take other diabetes injectables, including Byetta (exenatide), Bydureon (exenatide ER), Victoza (liraglutide), or Trulicity (dulaglutide).  . If your CBG is greater than 220 mg/dL, you may take  of your sliding scale  . (correction) dose of insulin.  Patient Signature:  Date:   Nurse Signature:  Date:   Reviewed and Endorsed by [facility] Patient Education Committee, August 2015

## 2018-01-03 NOTE — Progress Notes (Signed)
EKG 10-31-17 in epic

## 2018-01-04 ENCOUNTER — Other Ambulatory Visit: Payer: Self-pay

## 2018-01-04 ENCOUNTER — Encounter (HOSPITAL_COMMUNITY)
Admission: RE | Admit: 2018-01-04 | Discharge: 2018-01-04 | Disposition: A | Discharge status: Home / Self Care | Payer: Non-veteran care | Source: Ambulatory Visit | Attending: Urology | Admitting: Urology

## 2018-01-04 ENCOUNTER — Encounter (HOSPITAL_COMMUNITY): Payer: Self-pay

## 2018-01-04 DIAGNOSIS — N202 Calculus of kidney with calculus of ureter: Secondary | ICD-10-CM | POA: Diagnosis not present

## 2018-01-04 DIAGNOSIS — Z01812 Encounter for preprocedural laboratory examination: Secondary | ICD-10-CM | POA: Insufficient documentation

## 2018-01-04 HISTORY — DX: Personal history of urinary calculi: Z87.442

## 2018-01-04 HISTORY — DX: Sleep apnea, unspecified: G47.30

## 2018-01-04 HISTORY — DX: Adverse effect of unspecified anesthetic, initial encounter: T41.45XA

## 2018-01-04 HISTORY — DX: Other complications of anesthesia, initial encounter: T88.59XA

## 2018-01-04 LAB — GLUCOSE, CAPILLARY: GLUCOSE-CAPILLARY: 188 mg/dL — AB (ref 70–99)

## 2018-01-04 LAB — CBC
HCT: 33.1 % — ABNORMAL LOW (ref 39.0–52.0)
Hemoglobin: 9.9 g/dL — ABNORMAL LOW (ref 13.0–17.0)
MCH: 25.2 pg — AB (ref 26.0–34.0)
MCHC: 29.9 g/dL — AB (ref 30.0–36.0)
MCV: 84.2 fL (ref 80.0–100.0)
PLATELETS: 285 10*3/uL (ref 150–400)
RBC: 3.93 MIL/uL — ABNORMAL LOW (ref 4.22–5.81)
RDW: 14.5 % (ref 11.5–15.5)
WBC: 8.8 10*3/uL (ref 4.0–10.5)
nRBC: 0 % (ref 0.0–0.2)

## 2018-01-04 LAB — BASIC METABOLIC PANEL
Anion gap: 10 (ref 5–15)
BUN: 18 mg/dL (ref 8–23)
CO2: 27 mmol/L (ref 22–32)
CREATININE: 1.85 mg/dL — AB (ref 0.61–1.24)
Calcium: 9.4 mg/dL (ref 8.9–10.3)
Chloride: 104 mmol/L (ref 98–111)
GFR calc Af Amer: 43 mL/min — ABNORMAL LOW (ref >60)
GFR, EST NON AFRICAN AMERICAN: 37 mL/min — AB (ref >60)
Glucose, Bld: 206 mg/dL — ABNORMAL HIGH (ref 70–99)
Potassium: 4.2 mmol/L (ref 3.5–5.1)
SODIUM: 141 mmol/L (ref 135–145)

## 2018-01-04 LAB — HEMOGLOBIN A1C
Hgb A1c MFr Bld: 5.4 % (ref 4.8–5.6)
MEAN PLASMA GLUCOSE: 108.28 mg/dL

## 2018-01-04 NOTE — Progress Notes (Signed)
CBC and BMP routed to Dr. Ronne Binning via epic

## 2018-01-12 ENCOUNTER — Other Ambulatory Visit: Payer: Self-pay

## 2018-01-12 ENCOUNTER — Encounter (HOSPITAL_COMMUNITY): Payer: Self-pay | Admitting: *Deleted

## 2018-01-12 ENCOUNTER — Ambulatory Visit (HOSPITAL_COMMUNITY): Payer: No Typology Code available for payment source | Admitting: Anesthesiology

## 2018-01-12 ENCOUNTER — Ambulatory Visit (HOSPITAL_COMMUNITY): Payer: No Typology Code available for payment source

## 2018-01-12 ENCOUNTER — Observation Stay (HOSPITAL_COMMUNITY)
Admission: RE | Admit: 2018-01-12 | Discharge: 2018-01-13 | Disposition: A | Discharge status: Home / Self Care | Payer: No Typology Code available for payment source | Source: Ambulatory Visit | Attending: Urology | Admitting: Urology

## 2018-01-12 ENCOUNTER — Encounter (HOSPITAL_COMMUNITY)
Admission: RE | Disposition: A | Discharge status: Home / Self Care | Payer: Self-pay | Source: Ambulatory Visit | Attending: Urology

## 2018-01-12 DIAGNOSIS — Z794 Long term (current) use of insulin: Secondary | ICD-10-CM | POA: Diagnosis not present

## 2018-01-12 DIAGNOSIS — Z8673 Personal history of transient ischemic attack (TIA), and cerebral infarction without residual deficits: Secondary | ICD-10-CM | POA: Diagnosis not present

## 2018-01-12 DIAGNOSIS — I10 Essential (primary) hypertension: Secondary | ICD-10-CM | POA: Insufficient documentation

## 2018-01-12 DIAGNOSIS — G4730 Sleep apnea, unspecified: Secondary | ICD-10-CM | POA: Insufficient documentation

## 2018-01-12 DIAGNOSIS — Z79899 Other long term (current) drug therapy: Secondary | ICD-10-CM | POA: Diagnosis not present

## 2018-01-12 DIAGNOSIS — Z87442 Personal history of urinary calculi: Secondary | ICD-10-CM | POA: Diagnosis not present

## 2018-01-12 DIAGNOSIS — Z23 Encounter for immunization: Secondary | ICD-10-CM | POA: Diagnosis not present

## 2018-01-12 DIAGNOSIS — N200 Calculus of kidney: Principal | ICD-10-CM | POA: Insufficient documentation

## 2018-01-12 DIAGNOSIS — E119 Type 2 diabetes mellitus without complications: Secondary | ICD-10-CM | POA: Diagnosis not present

## 2018-01-12 HISTORY — PX: NEPHROLITHOTOMY: SHX5134

## 2018-01-12 LAB — BASIC METABOLIC PANEL
ANION GAP: 8 (ref 5–15)
BUN: 14 mg/dL (ref 8–23)
CHLORIDE: 109 mmol/L (ref 98–111)
CO2: 28 mmol/L (ref 22–32)
Calcium: 9.5 mg/dL (ref 8.9–10.3)
Creatinine, Ser: 1.78 mg/dL — ABNORMAL HIGH (ref 0.61–1.24)
GFR calc Af Amer: 45 mL/min — ABNORMAL LOW (ref >60)
GFR, EST NON AFRICAN AMERICAN: 39 mL/min — AB (ref >60)
GLUCOSE: 159 mg/dL — AB (ref 70–99)
POTASSIUM: 3.5 mmol/L (ref 3.5–5.1)
Sodium: 145 mmol/L (ref 135–145)

## 2018-01-12 LAB — CBC
HCT: 30.3 % — ABNORMAL LOW (ref 39.0–52.0)
HEMOGLOBIN: 8.9 g/dL — AB (ref 13.0–17.0)
MCH: 25.4 pg — ABNORMAL LOW (ref 26.0–34.0)
MCHC: 29.4 g/dL — ABNORMAL LOW (ref 30.0–36.0)
MCV: 86.3 fL (ref 80.0–100.0)
NRBC: 0 % (ref 0.0–0.2)
PLATELETS: 308 10*3/uL (ref 150–400)
RBC: 3.51 MIL/uL — AB (ref 4.22–5.81)
RDW: 15.3 % (ref 11.5–15.5)
WBC: 8.4 10*3/uL (ref 4.0–10.5)

## 2018-01-12 LAB — GLUCOSE, CAPILLARY
GLUCOSE-CAPILLARY: 155 mg/dL — AB (ref 70–99)
Glucose-Capillary: 157 mg/dL — ABNORMAL HIGH (ref 70–99)

## 2018-01-12 SURGERY — NEPHROLITHOTOMY PERCUTANEOUS
Anesthesia: General | Laterality: Left

## 2018-01-12 MED ORDER — BELLADONNA ALKALOIDS-OPIUM 16.2-60 MG RE SUPP
1.0000 | Freq: Four times a day (QID) | RECTAL | Status: DC | PRN
  Administered 2018-01-12: 1 via RECTAL

## 2018-01-12 MED ORDER — DEXMEDETOMIDINE HCL IN NACL 200 MCG/50ML IV SOLN
INTRAVENOUS | Status: DC | PRN
  Administered 2018-01-12: 8 ug via INTRAVENOUS
  Administered 2018-01-12: 20 ug via INTRAVENOUS
  Administered 2018-01-12: 24 ug via INTRAVENOUS

## 2018-01-12 MED ORDER — PROPOFOL 10 MG/ML IV BOLUS
INTRAVENOUS | Status: DC | PRN
  Administered 2018-01-12: 200 mg via INTRAVENOUS

## 2018-01-12 MED ORDER — CEFAZOLIN SODIUM-DEXTROSE 2-4 GM/100ML-% IV SOLN
2.0000 g | INTRAVENOUS | Status: AC
  Administered 2018-01-12: 2 g via INTRAVENOUS
  Filled 2018-01-12: qty 100

## 2018-01-12 MED ORDER — ONDANSETRON HCL 4 MG/2ML IJ SOLN
INTRAMUSCULAR | Status: AC
  Filled 2018-01-12: qty 2

## 2018-01-12 MED ORDER — SUGAMMADEX SODIUM 200 MG/2ML IV SOLN
INTRAVENOUS | Status: AC
  Filled 2018-01-12: qty 2

## 2018-01-12 MED ORDER — DIPHENHYDRAMINE HCL 12.5 MG/5ML PO ELIX: 12.5000 mg | ORAL_SOLUTION | Freq: Four times a day (QID) | ORAL | Status: DC | PRN

## 2018-01-12 MED ORDER — SODIUM CHLORIDE 0.9 % IR SOLN
Status: DC | PRN
  Administered 2018-01-12: 6000 mL

## 2018-01-12 MED ORDER — ZOLPIDEM TARTRATE 5 MG PO TABS: 5.0000 mg | ORAL_TABLET | Freq: Every evening | ORAL | Status: DC | PRN

## 2018-01-12 MED ORDER — DEXAMETHASONE SODIUM PHOSPHATE 10 MG/ML IJ SOLN
INTRAMUSCULAR | Status: DC | PRN
  Administered 2018-01-12: 10 mg via INTRAVENOUS

## 2018-01-12 MED ORDER — HYDROMORPHONE HCL 1 MG/ML IJ SOLN
0.5000 mg | INTRAMUSCULAR | Status: DC | PRN
  Administered 2018-01-12: 1 mg via INTRAVENOUS
  Filled 2018-01-12: qty 1

## 2018-01-12 MED ORDER — INFLUENZA VAC SPLIT QUAD 0.5 ML IM SUSY
0.5000 mL | PREFILLED_SYRINGE | INTRAMUSCULAR | Status: AC
  Administered 2018-01-13: 0.5 mL via INTRAMUSCULAR
  Filled 2018-01-12: qty 0.5

## 2018-01-12 MED ORDER — PROMETHAZINE HCL 25 MG/ML IJ SOLN: 6.2500 mg | INTRAMUSCULAR | Status: DC | PRN

## 2018-01-12 MED ORDER — ONDANSETRON HCL 4 MG/2ML IJ SOLN
4.0000 mg | INTRAMUSCULAR | Status: DC | PRN
  Administered 2018-01-13: 4 mg via INTRAVENOUS
  Filled 2018-01-12: qty 2

## 2018-01-12 MED ORDER — EPHEDRINE SULFATE 50 MG/ML IJ SOLN
INTRAMUSCULAR | Status: DC | PRN
  Administered 2018-01-12: 5 mg via INTRAVENOUS
  Administered 2018-01-12: 10 mg via INTRAVENOUS

## 2018-01-12 MED ORDER — LACTATED RINGERS IV SOLN: INTRAVENOUS | Status: DC

## 2018-01-12 MED ORDER — PROPOFOL 10 MG/ML IV BOLUS
INTRAVENOUS | Status: AC
  Filled 2018-01-12: qty 20

## 2018-01-12 MED ORDER — LACTATED RINGERS IV SOLN
INTRAVENOUS | Status: DC
  Administered 2018-01-12 (×2): via INTRAVENOUS

## 2018-01-12 MED ORDER — SUGAMMADEX SODIUM 200 MG/2ML IV SOLN
INTRAVENOUS | Status: DC | PRN
  Administered 2018-01-12: 200 mg via INTRAVENOUS

## 2018-01-12 MED ORDER — HYDROMORPHONE HCL 1 MG/ML IJ SOLN
0.2500 mg | INTRAMUSCULAR | Status: DC | PRN
  Administered 2018-01-12: 0.5 mg via INTRAVENOUS

## 2018-01-12 MED ORDER — ACETAMINOPHEN 325 MG PO TABS: 650.0000 mg | ORAL_TABLET | ORAL | Status: DC | PRN

## 2018-01-12 MED ORDER — BELLADONNA ALKALOIDS-OPIUM 16.2-60 MG RE SUPP
RECTAL | Status: AC
  Administered 2018-01-12: 1 via RECTAL
  Filled 2018-01-12: qty 1

## 2018-01-12 MED ORDER — HYDRALAZINE HCL 20 MG/ML IJ SOLN
5.0000 mg | INTRAMUSCULAR | Status: DC | PRN
  Administered 2018-01-12: 5 mg via INTRAVENOUS

## 2018-01-12 MED ORDER — IOHEXOL 300 MG/ML  SOLN
INTRAMUSCULAR | Status: DC | PRN
  Administered 2018-01-12: 18 mL via URETHRAL

## 2018-01-12 MED ORDER — HYDRALAZINE HCL 20 MG/ML IJ SOLN
INTRAMUSCULAR | Status: AC
  Administered 2018-01-12: 5 mg via INTRAVENOUS
  Filled 2018-01-12: qty 1

## 2018-01-12 MED ORDER — MEPERIDINE HCL 50 MG/ML IJ SOLN: 6.2500 mg | INTRAMUSCULAR | Status: DC | PRN

## 2018-01-12 MED ORDER — LIDOCAINE HCL (CARDIAC) PF 100 MG/5ML IV SOSY
PREFILLED_SYRINGE | INTRAVENOUS | Status: DC | PRN
  Administered 2018-01-12: 100 mg via INTRAVENOUS

## 2018-01-12 MED ORDER — ROCURONIUM BROMIDE 100 MG/10ML IV SOLN
INTRAVENOUS | Status: DC | PRN
  Administered 2018-01-12: 10 mg via INTRAVENOUS
  Administered 2018-01-12: 60 mg via INTRAVENOUS

## 2018-01-12 MED ORDER — ONDANSETRON HCL 4 MG/2ML IJ SOLN
INTRAMUSCULAR | Status: DC | PRN
  Administered 2018-01-12: 4 mg via INTRAVENOUS

## 2018-01-12 MED ORDER — MIDAZOLAM HCL 5 MG/5ML IJ SOLN
INTRAMUSCULAR | Status: DC | PRN
  Administered 2018-01-12: 2 mg via INTRAVENOUS

## 2018-01-12 MED ORDER — DIPHENHYDRAMINE HCL 50 MG/ML IJ SOLN: 12.5000 mg | Freq: Four times a day (QID) | INTRAMUSCULAR | Status: DC | PRN

## 2018-01-12 MED ORDER — OXYCODONE-ACETAMINOPHEN 5-325 MG PO TABS
1.0000 | ORAL_TABLET | ORAL | Status: DC | PRN
  Administered 2018-01-12 – 2018-01-13 (×2): 1 via ORAL
  Filled 2018-01-12: qty 1
  Filled 2018-01-12: qty 2

## 2018-01-12 MED ORDER — MIDAZOLAM HCL 2 MG/2ML IJ SOLN
INTRAMUSCULAR | Status: AC
  Filled 2018-01-12: qty 2

## 2018-01-12 MED ORDER — DEXAMETHASONE SODIUM PHOSPHATE 10 MG/ML IJ SOLN
INTRAMUSCULAR | Status: AC
  Filled 2018-01-12: qty 1

## 2018-01-12 MED ORDER — FENTANYL CITRATE (PF) 250 MCG/5ML IJ SOLN
INTRAMUSCULAR | Status: AC
  Filled 2018-01-12: qty 5

## 2018-01-12 MED ORDER — HYDROMORPHONE HCL 2 MG/ML IJ SOLN
INTRAMUSCULAR | Status: AC
  Filled 2018-01-12: qty 1

## 2018-01-12 MED ORDER — FENTANYL CITRATE (PF) 100 MCG/2ML IJ SOLN
INTRAMUSCULAR | Status: DC | PRN
  Administered 2018-01-12: 100 ug via INTRAVENOUS
  Administered 2018-01-12: 50 ug via INTRAVENOUS
  Administered 2018-01-12: 100 ug via INTRAVENOUS

## 2018-01-12 MED ORDER — SODIUM CHLORIDE 0.9 % IV SOLN
INTRAVENOUS | Status: DC
  Administered 2018-01-12 – 2018-01-13 (×2): via INTRAVENOUS

## 2018-01-12 MED ORDER — DEXMEDETOMIDINE HCL IN NACL 200 MCG/50ML IV SOLN
INTRAVENOUS | Status: AC
  Filled 2018-01-12: qty 50

## 2018-01-12 MED ORDER — LIDOCAINE 2% (20 MG/ML) 5 ML SYRINGE
INTRAMUSCULAR | Status: AC
  Filled 2018-01-12: qty 5

## 2018-01-12 MED ORDER — EPHEDRINE 5 MG/ML INJ
INTRAVENOUS | Status: AC
  Filled 2018-01-12: qty 10

## 2018-01-12 SURGICAL SUPPLY — 67 items
BAG URINE DRAINAGE (UROLOGICAL SUPPLIES) ×3 IMPLANT
BASKET STONE NITINOL 3FRX115MB (UROLOGICAL SUPPLIES) IMPLANT
BASKET ZERO TIP NITINOL 2.4FR (BASKET) IMPLANT
BENZOIN TINCTURE PRP APPL 2/3 (GAUZE/BANDAGES/DRESSINGS) ×3 IMPLANT
BLADE SURG 15 STRL LF DISP TIS (BLADE) ×1 IMPLANT
BLADE SURG 15 STRL SS (BLADE) ×2
CATH FOLEY 2W COUNCIL 20FR 5CC (CATHETERS) IMPLANT
CATH FOLEY 2WAY SLVR  5CC 16FR (CATHETERS)
CATH FOLEY 2WAY SLVR  5CC 18FR (CATHETERS)
CATH FOLEY 2WAY SLVR 5CC 16FR (CATHETERS) IMPLANT
CATH FOLEY 2WAY SLVR 5CC 18FR (CATHETERS) IMPLANT
CATH IMAGER II 65CM (CATHETERS) ×3 IMPLANT
CATH INTERMIT  6FR 70CM (CATHETERS) IMPLANT
CATH ROBINSON RED A/P 20FR (CATHETERS) IMPLANT
CATH ULTRATHANE 14FR (CATHETERS) ×3 IMPLANT
CATH URET DUAL LUMEN 6-10FR 50 (CATHETERS) IMPLANT
CATH X-FORCE N30 NEPHROSTOMY (TUBING) ×3 IMPLANT
COVER SURGICAL LIGHT HANDLE (MISCELLANEOUS) IMPLANT
COVER WAND RF STERILE (DRAPES) ×3 IMPLANT
DRAPE C-ARM 42X120 X-RAY (DRAPES) ×3 IMPLANT
DRAPE LINGEMAN PERC (DRAPES) ×3 IMPLANT
DRAPE SHEET LG 3/4 BI-LAMINATE (DRAPES) ×3 IMPLANT
DRAPE SURG IRRIG POUCH 19X23 (DRAPES) ×3 IMPLANT
DRSG PAD ABDOMINAL 8X10 ST (GAUZE/BANDAGES/DRESSINGS) IMPLANT
DRSG TEGADERM 2-3/8X2-3/4 SM (GAUZE/BANDAGES/DRESSINGS) ×6 IMPLANT
DRSG TEGADERM 4X4.75 (GAUZE/BANDAGES/DRESSINGS) ×6 IMPLANT
DRSG TEGADERM 8X12 (GAUZE/BANDAGES/DRESSINGS) ×6 IMPLANT
FIBER LASER FLEXIVA 1000 (UROLOGICAL SUPPLIES) IMPLANT
FIBER LASER FLEXIVA 365 (UROLOGICAL SUPPLIES) IMPLANT
FIBER LASER FLEXIVA 550 (UROLOGICAL SUPPLIES) IMPLANT
FIBER LASER TRAC TIP (UROLOGICAL SUPPLIES) IMPLANT
GAUZE SPONGE 4X4 12PLY STRL (GAUZE/BANDAGES/DRESSINGS) IMPLANT
GLOVE BIO SURGEON STRL SZ8 (GLOVE) ×3 IMPLANT
GLOVE BIOGEL PI IND STRL 8 (GLOVE) ×1 IMPLANT
GLOVE BIOGEL PI INDICATOR 8 (GLOVE) ×2
GOWN STRL REUS W/TWL LRG LVL3 (GOWN DISPOSABLE) IMPLANT
GOWN STRL REUS W/TWL XL LVL3 (GOWN DISPOSABLE) ×3 IMPLANT
GUIDEWIRE AMPLAZ .035X145 (WIRE) ×6 IMPLANT
GUIDEWIRE STR DUAL SENSOR (WIRE) ×3 IMPLANT
HOLDER FOLEY CATH W/STRAP (MISCELLANEOUS) ×3 IMPLANT
IV SET EXTENSION CATH 6 NF (IV SETS) IMPLANT
KIT BASIN OR (CUSTOM PROCEDURE TRAY) ×3 IMPLANT
MANIFOLD NEPTUNE II (INSTRUMENTS) ×3 IMPLANT
NEEDLE TROCAR 18X15 ECHO (NEEDLE) IMPLANT
NEEDLE TROCAR 18X20 (NEEDLE) IMPLANT
NS IRRIG 1000ML POUR BTL (IV SOLUTION) ×3 IMPLANT
PACK CYSTO (CUSTOM PROCEDURE TRAY) ×3 IMPLANT
PAD ABD 8X10 STRL (GAUZE/BANDAGES/DRESSINGS) ×6 IMPLANT
PROBE LITHOCLAST ULTRA 3.8X403 (UROLOGICAL SUPPLIES) IMPLANT
PROBE PNEUMATIC 1.0MMX570MM (UROLOGICAL SUPPLIES) IMPLANT
SET AMPLATZ RENAL DILATOR (MISCELLANEOUS) IMPLANT
SET IRRIG Y TYPE TUR BLADDER L (SET/KITS/TRAYS/PACK) IMPLANT
SHEATH PEELAWAY SET 9 (SHEATH) ×3 IMPLANT
SPONGE LAP 4X18 RFD (DISPOSABLE) ×3 IMPLANT
STENT CONTOUR 6FRX26X.038 (STENTS) ×3 IMPLANT
STONE CATCHER W/TUBE ADAPTER (UROLOGICAL SUPPLIES) IMPLANT
SUT SILK 2 0 30  PSL (SUTURE) ×2
SUT SILK 2 0 30 PSL (SUTURE) ×1 IMPLANT
SYR 10ML LL (SYRINGE) ×3 IMPLANT
SYR 20CC LL (SYRINGE) ×6 IMPLANT
SYR 50ML LL SCALE MARK (SYRINGE) ×3 IMPLANT
TOWEL OR 17X26 10 PK STRL BLUE (TOWEL DISPOSABLE) ×3 IMPLANT
TUBE CONNECTING VINYL 14FR 30C (TUBING) ×3 IMPLANT
TUBING CONNECTING 10 (TUBING) ×2 IMPLANT
TUBING CONNECTING 10' (TUBING) ×1
TUBING UROLOGY SET (TUBING) ×6 IMPLANT
WATER STERILE IRR 1000ML POUR (IV SOLUTION) IMPLANT

## 2018-01-12 NOTE — Anesthesia Preprocedure Evaluation (Addendum)
Anesthesia Evaluation  Patient identified by MRN, date of birth, ID band Patient awake    Reviewed: Allergy & Precautions, NPO status , Patient's Chart, lab work & pertinent test results  Airway Mallampati: II  TM Distance: >3 FB Neck ROM: Full    Dental no notable dental hx. (+) Teeth Intact, Dental Advisory Given   Pulmonary sleep apnea and Continuous Positive Airway Pressure Ventilation ,    Pulmonary exam normal breath sounds clear to auscultation       Cardiovascular hypertension, Normal cardiovascular exam Rhythm:Regular Rate:Normal     Neuro/Psych CVA    GI/Hepatic negative GI ROS, Neg liver ROS,   Endo/Other  diabetes, Type 2, Oral Hypoglycemic Agents, Insulin Dependent  Renal/GU negative Renal ROS     Musculoskeletal   Abdominal   Peds  Hematology negative hematology ROS (+)   Anesthesia Other Findings   Reproductive/Obstetrics                            Lab Results  Component Value Date   WBC 8.8 01/04/2018   HGB 9.9 (L) 01/04/2018   HCT 33.1 (L) 01/04/2018   MCV 84.2 01/04/2018   PLT 285 01/04/2018   Lab Results  Component Value Date   CREATININE 1.85 (H) 01/04/2018   BUN 18 01/04/2018   NA 141 01/04/2018   K 4.2 01/04/2018   CL 104 01/04/2018   CO2 27 01/04/2018   Lab Results  Component Value Date   INR 1.09 10/27/2017   EKG: normal sinus rhythm.  Anesthesia Physical Anesthesia Plan  ASA: II  Anesthesia Plan: General   Post-op Pain Management:    Induction: Intravenous  PONV Risk Score and Plan: 3 and Ondansetron, Dexamethasone and Midazolam  Airway Management Planned: Oral ETT  Additional Equipment: None  Intra-op Plan:   Post-operative Plan: Extubation in OR  Informed Consent: I have reviewed the patients History and Physical, chart, labs and discussed the procedure including the risks, benefits and alternatives for the proposed anesthesia with  the patient or authorized representative who has indicated his/her understanding and acceptance.   Dental advisory given  Plan Discussed with: CRNA  Anesthesia Plan Comments:        Anesthesia Quick Evaluation

## 2018-01-12 NOTE — Transfer of Care (Signed)
Immediate Anesthesia Transfer of Care Note  Patient: Dillon Phillips  Procedure(s) Performed: NEPHROLITHOTOMY PERCUTANEOUS (Left )  Patient Location: PACU  Anesthesia Type:General  Level of Consciousness: sedated, patient cooperative and responds to stimulation  Airway & Oxygen Therapy: Patient Spontanous Breathing and Patient connected to face mask oxygen  Post-op Assessment: Report given to RN and Post -op Vital signs reviewed and stable  Post vital signs: Reviewed and stable  Last Vitals:  Vitals Value Taken Time  BP 185/106 01/12/2018  4:00 PM  Temp    Pulse 78 01/12/2018  4:03 PM  Resp 18 01/12/2018  4:03 PM  SpO2 100 % 01/12/2018  4:03 PM  Vitals shown include unvalidated device data.  Last Pain:  Vitals:   01/12/18 1213  TempSrc:   PainSc: 0-No pain         Complications: No apparent anesthesia complications

## 2018-01-12 NOTE — Anesthesia Procedure Notes (Signed)
Procedure Name: Intubation Date/Time: 01/12/2018 2:12 PM Performed by: Thornell Mule, CRNA Pre-anesthesia Checklist: Patient identified, Emergency Drugs available, Suction available and Patient being monitored Patient Re-evaluated:Patient Re-evaluated prior to induction Oxygen Delivery Method: Circle system utilized Preoxygenation: Pre-oxygenation with 100% oxygen Induction Type: IV induction Ventilation: Mask ventilation without difficulty Laryngoscope Size: Miller and 3 Grade View: Grade I Tube type: Oral Tube size: 7.5 mm Number of attempts: 1 Airway Equipment and Method: Stylet and Oral airway Placement Confirmation: ETT inserted through vocal cords under direct vision,  positive ETCO2 and breath sounds checked- equal and bilateral Secured at: 22 cm Tube secured with: Tape Dental Injury: Teeth and Oropharynx as per pre-operative assessment

## 2018-01-12 NOTE — H&P (Signed)
Urology Admission H&P  Chief Complaint: left flank pain  History of Present Illness: [name] is a 62yo with a hx fo nephrolithiasis who had a left nephrostomy tube placed due to inability to find his left ureteral orifice at the time of cystoscopy. He has large 2cm renal calculi  Past Medical History:  Diagnosis Date  . Complication of anesthesia    woke up during procedure  . Diabetes mellitus without complication (HCC)    type 2  . History of kidney stones   . Hypertension   . Sleep apnea    uses cpap  . Stroke [facility])    patient states this happene June 2019   Past Surgical History:  Procedure Laterality Date  . CYSTOSCOPY WITH LITHOLAPAXY N/A 10/27/2017   Procedure: CYSTOSCOPY WITH LITHOLAPAXY;  Surgeon: Malen Gauze, MD;  Location: [facility] ORS;  Service: Urology;  Laterality: N/A;  . HOLMIUM LASER APPLICATION N/A 10/27/2017   Procedure: HOLMIUM LASER APPLICATION;  Surgeon: Malen Gauze, MD;  Location: [facility] ORS;  Service: Urology;  Laterality: N/A;  . IR NEPHROSTOGRAM RIGHT INITIAL PLACEMENT  10/28/2017  . IR NEPHROSTOMY EXCHANGE LEFT  12/23/2017  . IR NEPHROSTOMY PLACEMENT LEFT  10/28/2017  . Left percutaneous nephrolithotomy     Dr. Ronne Binning 01-12-18    Home Medications:  Current Facility-Administered Medications  Medication Dose Route Frequency Provider Last Rate Last Dose  . ceFAZolin (ANCEF) IVPB 2g/100 mL premix  2 g Intravenous 30 min Pre-Op Reigan Tolliver, Mardene Celeste, MD      . lactated ringers infusion   Intravenous Continuous Shelton Silvas, MD 100 mL/hr at 01/12/18 1222     Allergies: No Known Allergies  History reviewed. No pertinent family history. Social History:  reports that he has never smoked. He has never used smokeless tobacco. He reports that he does not drink alcohol or use drugs.  Review of Systems  All other systems reviewed and are negative.   Physical Exam:  Vital signs in last 24 hours: Temp:  [97.9 F (36.6 C)] 97.9 F (36.6 C) (10/17  1159) Pulse Rate:  [76] 76 (10/17 1159) Resp:  [18] 18 (10/17 1159) BP: (174)/(109) 174/109 (10/17 1159) SpO2:  [99 %] 99 % (10/17 1159) Physical Exam  Constitutional: He is oriented to person, place, and time. He appears well-developed and well-nourished.  HENT:  Head: Normocephalic and atraumatic.  Eyes: Pupils are equal, round, and reactive to light. EOM are normal.  Neck: Normal range of motion. No thyromegaly present.  Cardiovascular: Normal rate and regular rhythm.  Respiratory: Effort normal. No respiratory distress.  GI: Soft. He exhibits no distension.  Musculoskeletal: Normal range of motion. He exhibits no edema.  Neurological: He is alert and oriented to person, place, and time.  Skin: Skin is warm and dry.  Psychiatric: He has a normal mood and affect. His behavior is normal. Judgment and thought content normal.    Laboratory Data:  Results for orders placed or performed during the hospital encounter of 01/12/18 (from the past 24 hour(s))  Glucose, capillary     Status: Abnormal   Collection Time: 01/12/18 12:16 PM  Result Value Ref Range   Glucose-Capillary 157 (H) 70 - 99 mg/dL   No results found for this or any previous visit (from the past 240 hour(s)). Creatinine: No results for input(s): CREATININE in the last 168 hours. Baseline Creatinine: unknwon  Impression/Assessment:  62yo with left renal calculi  Plan:  The risks/benefits/alternatives to Left PCNL was explained to the patient  and he understands and wishes to proceed with surgery  Wilkie Aye 01/12/2018, 12:54 PM

## 2018-01-12 NOTE — Op Note (Signed)
Preoperative diagnosis: Left renal stone  Postoperative diagnosis: Same  Procedure 1.  Left percutaneous nephrostolithotomy for stone greater than 2 cm 2.  Left nephrostogram 3.  Intraoperative fluoroscopy, under 1 hour, with interpretation 4.  Placement of a 6 x 26 double-J ureteral stent. 5.  Left ureteroscopy with lithotripsy and stone extraction 6.  Placement of a 14 French nephrostomy tube 7.  Dilation of percutaneous tract  Attending: Dr. Ronne Binning  Anesthesia: General  Estimated blood loss: 100cc  Antibiotics: ancef  Drains: 1.  16 French Foley catheter 2.  6 x 26 left double-J ureteral stent 3.  14 French nephrostomy tube  Specimens: Stone for analysis  Findings: 9mm UPJ calculus, 8mm and 6mm lower pole calculus  Indications: Patient is a 62 year old male with a history of large left renal stones.  After discussing treatment options and decided she was left percutaneous nephrostolithotomy.  Patient already has a nephrostomy tube.  Procedure in detail: Prior to procedure consent was obtained.  Patient was brought to the operating room debridement was done to ensure correct patient, correct procedure, and correct site.  General anesthesia was administered.  A 16 French Foley catheter was in place.  The patient was then placed in the prone position.  His nephrostomy tube and left flank was then prepped and draped in usual sterile fashion.  A nephrostogram was obtained and findings noted above.  Through the nephrostomy tube we then placed a sensor wire.  Sensor wire was coiled in the renal pelvis we then removed the nephrostomy tube.  We then made an incision at the level of the skin and over the wire we then placed a NephroMax dilator.  We dilated the nephrostomy tract to 30 [place] and held this 18 cm of water for 1 minute.  We then  placed the access sheath over the balloon.  The balloon was then deflated.  We then used a rigid nephroscope to perform nephroscopy.  We encountered a  large renal pelvis stone that was impacted at the ureteropelvic junction.  We used the grasper to remove the stones.  Once the stones were removed we then were able to perform ureteroscopy with a flexible ureteroscope.  We advanced ureteroscope down to the level of bladder.  We then placed a second wire through the ureteroscope into the bladder.  We then removed the ureteroscope and over the wire placed a 6 x 26 double-J ureteral stent.  The wire was then removed and good coil was noted in the renal pelvis under direct vision in the bladder under fluoroscopy.  We then placed a 14 French nephrostomy tube through the sheath into the renal pelvis. We then removed the access sheath in and obtain another nephrostogram.  We noted minimal extravasation of contrast.  We then secured the nephrostomy tubes with 0 silks in interrupted fashion.  Dressing was placed over the nephrostomy tube site and this then concluded the procedure was well-tolerated by the patient.  Complications: None  Condition: Stable, extubated, transferred to PACU  Plan: Patient is to be admitted overnight for observation.  His Foley catheter through the morning.  He is then to be discharged home and followup in 3 days for neph tube removal

## 2018-01-13 ENCOUNTER — Encounter (HOSPITAL_COMMUNITY): Payer: Self-pay | Admitting: Urology

## 2018-01-13 DIAGNOSIS — N200 Calculus of kidney: Secondary | ICD-10-CM | POA: Diagnosis not present

## 2018-01-13 LAB — CBC
HCT: 31.5 % — ABNORMAL LOW (ref 39.0–52.0)
HEMOGLOBIN: 9.5 g/dL — AB (ref 13.0–17.0)
MCH: 25.1 pg — AB (ref 26.0–34.0)
MCHC: 30.2 g/dL (ref 30.0–36.0)
MCV: 83.3 fL (ref 80.0–100.0)
PLATELETS: 313 10*3/uL (ref 150–400)
RBC: 3.78 MIL/uL — AB (ref 4.22–5.81)
RDW: 15.3 % (ref 11.5–15.5)
WBC: 13.7 10*3/uL — AB (ref 4.0–10.5)
nRBC: 0 % (ref 0.0–0.2)

## 2018-01-13 LAB — BASIC METABOLIC PANEL
Anion gap: 10 (ref 5–15)
BUN: 15 mg/dL (ref 8–23)
CHLORIDE: 102 mmol/L (ref 98–111)
CO2: 24 mmol/L (ref 22–32)
CREATININE: 1.57 mg/dL — AB (ref 0.61–1.24)
Calcium: 9 mg/dL (ref 8.9–10.3)
GFR, EST AFRICAN AMERICAN: 52 mL/min — AB (ref >60)
GFR, EST NON AFRICAN AMERICAN: 45 mL/min — AB (ref >60)
Glucose, Bld: 256 mg/dL — ABNORMAL HIGH (ref 70–99)
POTASSIUM: 4.7 mmol/L (ref 3.5–5.1)
SODIUM: 136 mmol/L (ref 135–145)

## 2018-01-13 LAB — GLUCOSE, CAPILLARY
GLUCOSE-CAPILLARY: 201 mg/dL — AB (ref 70–99)
GLUCOSE-CAPILLARY: 172 mg/dL — AB (ref 70–99)

## 2018-01-13 MED ORDER — OXYCODONE-ACETAMINOPHEN 5-325 MG PO TABS: 1.0000 | ORAL_TABLET | ORAL | 0 refills | Status: AC | PRN

## 2018-01-13 MED ORDER — LISINOPRIL 20 MG PO TABS
20.0000 mg | ORAL_TABLET | Freq: Every day | ORAL | Status: DC
  Administered 2018-01-13: 20 mg via ORAL
  Filled 2018-01-13: qty 1

## 2018-01-13 MED ORDER — LISINOPRIL 20 MG PO TABS
20.0000 mg | ORAL_TABLET | Freq: Every day | ORAL | Status: DC
  Filled 2018-01-13: qty 1

## 2018-01-13 MED ORDER — HYDRALAZINE HCL 20 MG/ML IJ SOLN: 5.0000 mg | INTRAMUSCULAR | Status: DC | PRN

## 2018-01-13 MED ORDER — ATORVASTATIN CALCIUM 40 MG PO TABS: 40.0000 mg | ORAL_TABLET | Freq: Every day | ORAL | Status: DC

## 2018-01-13 MED ORDER — ASPIRIN EC 81 MG PO TBEC
81.0000 mg | DELAYED_RELEASE_TABLET | Freq: Every day | ORAL | Status: DC
  Administered 2018-01-13: 81 mg via ORAL
  Filled 2018-01-13: qty 1

## 2018-01-13 MED ORDER — INSULIN ASPART 100 UNIT/ML ~~LOC~~ SOLN: 0.0000 [IU] | Freq: Three times a day (TID) | SUBCUTANEOUS | Status: DC

## 2018-01-13 MED ORDER — TAMSULOSIN HCL 0.4 MG PO CAPS: 0.4000 mg | ORAL_CAPSULE | Freq: Every day | ORAL | Status: DC

## 2018-01-13 MED ORDER — INSULIN ASPART 100 UNIT/ML ~~LOC~~ SOLN
0.0000 [IU] | Freq: Three times a day (TID) | SUBCUTANEOUS | Status: DC
  Administered 2018-01-13: 3 [IU] via SUBCUTANEOUS
  Administered 2018-01-13: 5 [IU] via SUBCUTANEOUS

## 2018-01-13 MED ORDER — LISINOPRIL 10 MG PO TABS
10.0000 mg | ORAL_TABLET | Freq: Every day | ORAL | Status: DC
  Administered 2018-01-13: 10 mg via ORAL

## 2018-01-13 MED ORDER — FINASTERIDE 5 MG PO TABS
5.0000 mg | ORAL_TABLET | Freq: Every day | ORAL | Status: DC
  Administered 2018-01-13: 5 mg via ORAL
  Filled 2018-01-13: qty 1

## 2018-01-13 NOTE — Care Management Note (Signed)
Case Management Note  Patient Details  Name: Dillon Phillips MRN: [id number] Date of Birth: 03/15/1956  Subjective/Objective: Admitted wrenal calculus. From home. No CM needs.                   Action/Plan:dc home.   Expected Discharge Date:  01/13/18               Expected Discharge Plan:  Home/Self Care  In-House Referral:     Discharge planning Services  CM Consult  Post Acute Care Choice:    Choice offered to:     DME Arranged:    DME Agency:     HH Arranged:    HH Agency:     Status of Service:  Completed, signed off  If discussed at [employer] of Stay Meetings, dates discussed:    Additional Comments:  Lanier Clam, RN 01/13/2018, 11:49 AM

## 2018-01-13 NOTE — Discharge Summary (Signed)
Physician Discharge Summary  Patient ID: Dillon Phillips MRN: [id number] DOB/AGE: Dec 28, 1955 62 y.o.  Admit date: 01/12/2018 Discharge date: 01/13/2018  Admission Diagnoses: Renal calculus Discharge Diagnoses:  Active Problems:   Renal calculus   Discharged Condition: good  Hospital Course: The patient tolerated the procedure well and was transferred to the floor on IV pain meds, IV fluid. On POD#1 foley was removed, pt was started on a regular diet and they ambulated in the halls.  Prior to discharge the pt was tolerating a regular diet, pain was controlled on PO pain meds, they were ambulating without difficulty, and they had normal bowel function.   Consults: None  Significant Diagnostic Studies: none  Treatments: surgery: L PCNL  Discharge Exam: Blood pressure (!) 168/105, pulse 72, temperature 98 F (36.7 C), temperature source Oral, resp. rate 18, height 6\' 4"  (1.93 m), weight 107.5 kg, SpO2 100 %. General appearance: alert, cooperative and appears stated age Head: Normocephalic, without obvious abnormality, atraumatic Nose: Nares normal. Septum midline. Mucosa normal. No drainage or sinus tenderness. Resp: clear to auscultation bilaterally Cardio: regular rate and rhythm, S1, S2 normal, no murmur, click, rub or gallop GI: soft, non-tender; bowel sounds normal; no masses,  no organomegaly Extremities: extremities normal, atraumatic, no cyanosis or edema Neurologic: Grossly normal  Disposition: Discharge disposition: 01-Home or Self Care       Discharge Instructions    Discharge patient   Complete by:  As directed    Discharge disposition:  01-Home or Self Care   Discharge patient date:  01/13/2018     Allergies as of 01/13/2018   No Known Allergies     Medication List    STOP taking these medications   oxycodone 5 MG capsule Commonly known as:  OXY-IR     TAKE these medications   aspirin EC 81 MG tablet Take 81 mg by mouth daily.   docusate  sodium 100 MG capsule Commonly known as:  COLACE Take 300 mg by mouth daily as needed for mild constipation.   finasteride 5 MG tablet Commonly known as:  PROSCAR Take 1 tablet (5 mg total) by mouth daily.   glipiZIDE 5 MG tablet Commonly known as:  GLUCOTROL Take 5 mg by mouth daily before breakfast.   lisinopril 20 MG tablet Commonly known as:  PRINIVIL,ZESTRIL Take 10 mg by mouth daily.   meclizine 25 MG tablet Commonly known as:  ANTIVERT Take 25 mg by mouth every 8 (eight) hours as needed for dizziness.   metFORMIN 500 MG 24 hr tablet Commonly known as:  GLUCOPHAGE-XR Take 1,000 mg by mouth daily with breakfast.   NOVOLOG MIX 70/30 FLEXPEN (70-30) 100 UNIT/ML FlexPen Generic drug:  insulin aspart protamine - aspart Inject 12-16 Units into the skin See admin instructions. Inject 12 units SQ in the morning and inject 16 units SQ in the evening   oxyCODONE-acetaminophen 5-325 MG tablet Commonly known as:  PERCOCET/ROXICET Take 1 tablet by mouth every 4 (four) hours as needed for moderate pain.   promethazine 12.5 MG tablet Commonly known as:  PHENERGAN Take 12.5 mg by mouth 3 (three) times daily as needed for nausea or vomiting.   simvastatin 80 MG tablet Commonly known as:  ZOCOR Take 40 mg by mouth every evening.   tamsulosin 0.4 MG Caps capsule Commonly known as:  FLOMAX Take 0.4 mg by mouth at bedtime.      Follow-up Information    Hillery Aldo, NP. Call on 01/16/2018.   Specialty:  Nurse Practitioner  Why:  nephrostomy tube removal Contact information: [address] [place] [address] [phone]           Signed: Wilkie Aye 01/13/2018, 10:06 PM

## 2018-01-13 NOTE — Progress Notes (Signed)
20 mg Lisinopril given to pt with no relief. BP 192/105. On call urologist paged. Awaiting new orders.

## 2018-01-13 NOTE — Discharge Instructions (Signed)
Percutaneous Nephrostomy  Percutaneous nephrostomy is a procedure to insert a flexible tube into your kidney so that urine can leave your body. This procedure may be done if a medical condition prevents urine from leaving your kidney in the usual way. Urine is normally carried from the kidneys to the bladder through narrow tubes called ureters. A ureter can become blocked because of conditions such as kidney stones, tumors, infection, or blood clots.  The nephrostomy tube will be inserted through your back. After the procedure, the tube will remain in place, and urine will drain from the kidney into a drainage bag outside your body. Draining the urine will relieve pressure and help prevent infection that could damage the kidney. Often, this procedure allows your health care provider to identify the cause of the blockage and plan appropriate treatment.  Tell a health care provider about:   Any allergies you have.   All medicines you are taking, including vitamins, herbs, eye drops, creams, and over-the-counter medicines.   Any problems you or family members have had with anesthetic medicines.   Any blood disorders you have.   Any surgeries you have had.   Any medical conditions you have.   Whether you are pregnant or may be pregnant.  What are the risks?  Generally, this is a safe procedure. However, problems may occur, including:   Infection.   Bleeding.   Allergic reactions to medicines or dyes used in the procedure.   Damage to other structures or organs.    What happens before the procedure?   Follow instructions from your health care provider about eating or drinking restrictions.   Ask your health care provider about:  ? Changing or stopping your regular medicines. This is especially important if you are taking diabetes medicines or blood thinners.  ? Taking medicines such as aspirin and ibuprofen. These medicines can thin your blood. Do not take these medicines before your procedure if your health  care provider instructs you not to.   You may be given antibiotic medicine to help prevent infection.   You may have blood tests to see how well your kidneys and liver are working and to see how well your blood can clot.   Plan to have someone take you home from the hospital or clinic.  What happens during the procedure?   To reduce your risk of infection:  ? Your health care team will wash or sanitize their hands.  ? Your skin will be washed with soap.   An IV tube will be inserted into one of your veins. You may be given medicines through this IV tube to help prevent nausea and pain.   You will be positioned on your abdomen.   You will be given one or more of the following:  ? A medicine to help you relax (sedative).  ? A medicine to numb the area (local anesthetic) where the nephrostomy tube will be inserted.   The nephrostomy tube, which is thin and flexible, will be inserted into a needle.   The needle will be inserted into your body and guided to your kidney. An imaging method that uses X-ray images (fluoroscopy) will be used to help guide the needle to the kidney.   A dye will be injected through the nephrostomy tube. Then, X-ray images that highlight your kidney will be taken.   Next, the needle will be removed, but the nephrostomy tube will be left in your kidney. The tube may be secured to your skin   with stitches (sutures).   A drainage bag will be attached to the nephrostomy tube. Urine will be able to drain from your kidney to this drainage bag outside your body.  The procedure may vary among health care providers and hospitals.  What happens after the procedure?   Your blood pressure, heart rate, breathing rate, and blood oxygen level will be monitored until the medicines you were given have worn off.   You will need to remain lying down for several hours.   You will be taught how to care for the nephrostomy tube and the drainage bag.   Donot drive for 24 hours if you were given a  sedative.  This information is not intended to replace advice given to you by your health care provider. Make sure you discuss any questions you have with your health care provider.  Document Released: 01/03/2013 Document Revised: 12/26/2015 Document Reviewed: 12/26/2015  [employer]vier Interactive Patient Education  2018 [employer]

## 2018-01-14 NOTE — Anesthesia Postprocedure Evaluation (Signed)
Anesthesia Post Note  Patient: Metallurgist  Procedure(s) Performed: NEPHROLITHOTOMY PERCUTANEOUS (Left )     Patient location during evaluation: PACU Anesthesia Type: General Level of consciousness: awake and alert Pain management: pain level controlled Vital Signs Assessment: post-procedure vital signs reviewed and stable Respiratory status: spontaneous breathing, nonlabored ventilation, respiratory function stable and patient connected to nasal cannula oxygen Cardiovascular status: blood pressure returned to baseline and stable Postop Assessment: no apparent nausea or vomiting Anesthetic complications: no    Last Vitals:  Vitals:   01/13/18 1019 01/13/18 1100  BP: (!) 170/111 (!) 168/105  Pulse:    Resp:    Temp:    SpO2:      Last Pain:  Vitals:   01/13/18 1146  TempSrc:   PainSc: 3                  Shelton Silvas

## 2018-02-17 ENCOUNTER — Ambulatory Visit (HOSPITAL_COMMUNITY): Admission: RE | Admit: 2018-02-17 | Payer: Non-veteran care | Source: Ambulatory Visit

## 2019-05-12 IMAGING — DX DG ABDOMEN 1V
1 series · 1 of 1 positions shown · non-contrast
Comparison: None.

CLINICAL DATA: Left-sided abdominal pain

EXAM:
ABDOMEN - 1 VIEW

[abdomen kub]
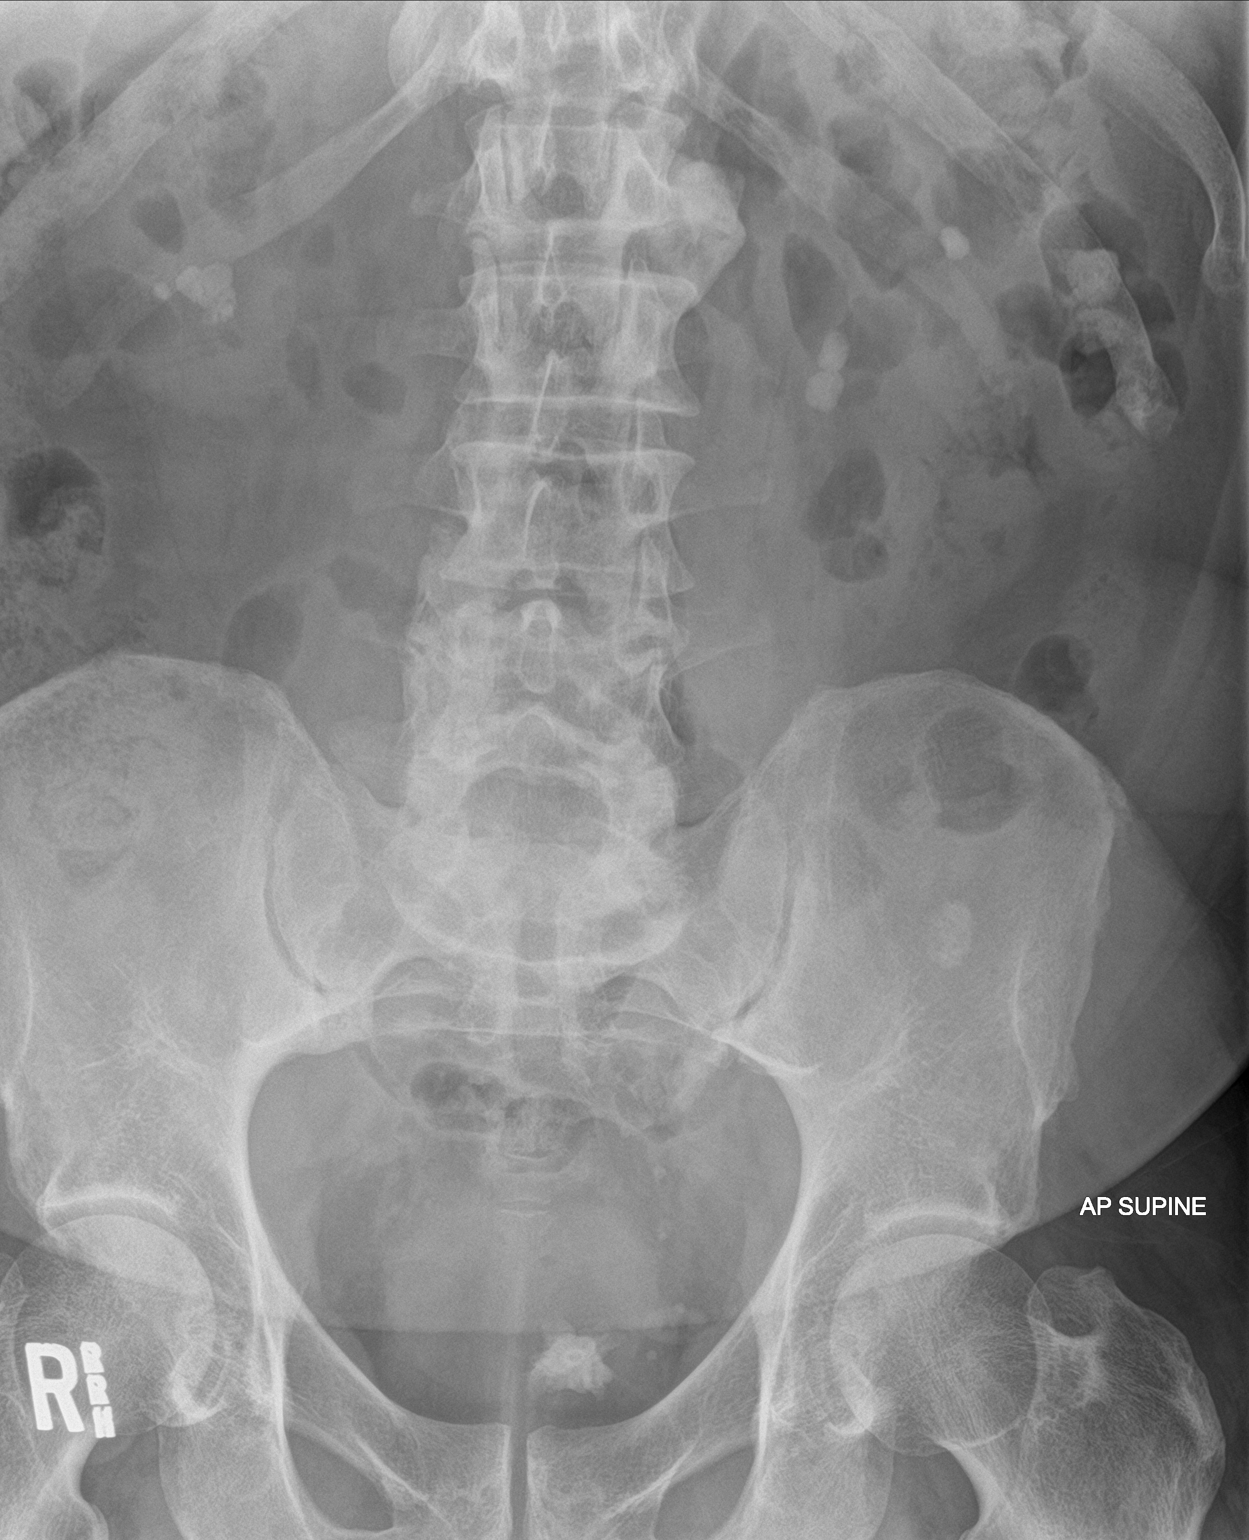

[1 of 1 positions shown; findings below may reference images not displayed]

FINDINGS: Scattered large and small bowel gas is noted. There are
calcifications overlying both kidneys consistent with nonobstructing
renal stones. Two calcifications are noted to the left of the
midline in the expected region of the proximal left ureter and may
represent renal pelvic or proximal ureteral stones. An irregular
bladder calculus is noted as well. No bony abnormality is seen.
IMPRESSION: Bilateral renal calculi. Two of these stones on the left appear to
lie in the expected region of the renal pelvis or proximal ureter.
CT urography may be helpful for further evaluation.

## 2019-05-14 IMAGING — CT CT RENAL STONE PROTOCOL
2 of 4 series · 16 of 46 positions shown, 18 images · non-contrast
Comparison: Plain film 10/26/2017

CLINICAL DATA: Bladder calculus, LEFT renal calculi, post
cystoscopy

EXAM:
CT ABDOMEN AND PELVIS WITHOUT CONTRAST
TECHNIQUE: Multidetector CT imaging of the abdomen and pelvis was performed
following the standard protocol without IV contrast.

[Series 2: axial st · axial · 0.84mm/px · z∈[+1227,+1682]mm · 13 of 105 slices shown, 15 images]
[im 7/105  soft-tissue]
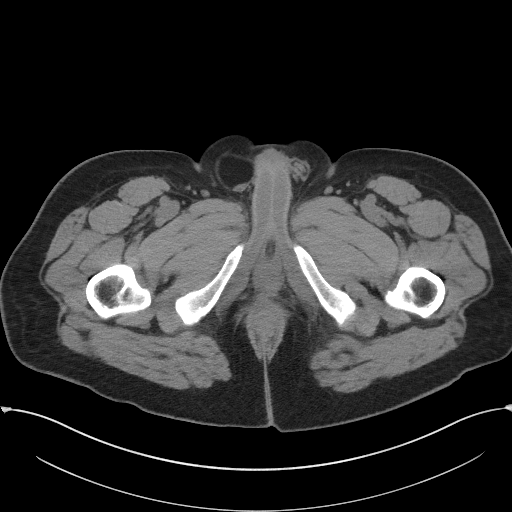
[im 7/105  bone]
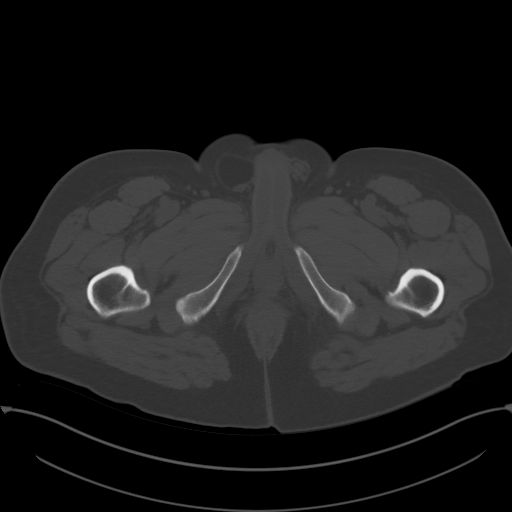
[im 14/105  soft-tissue]
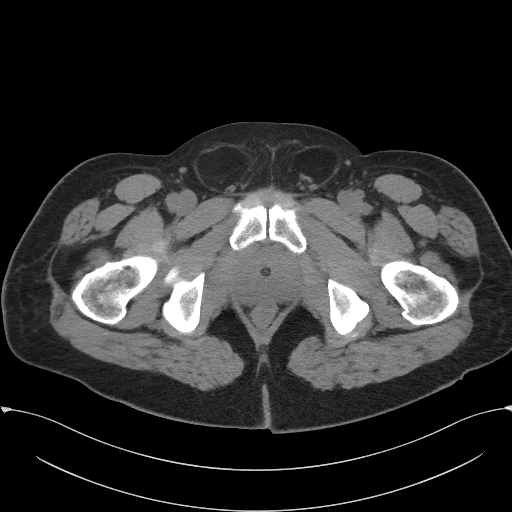
[im 20/105  soft-tissue]
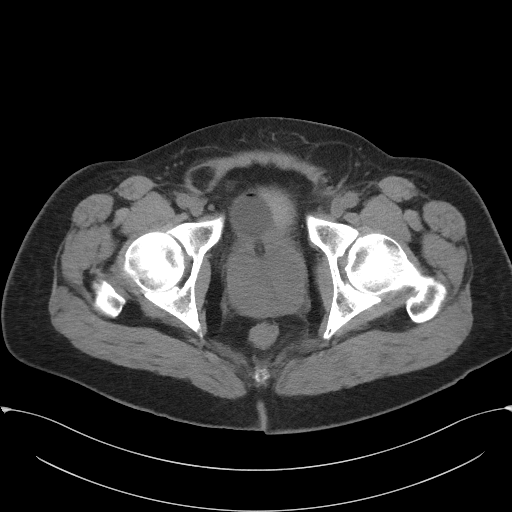
[im 33/105  soft-tissue]
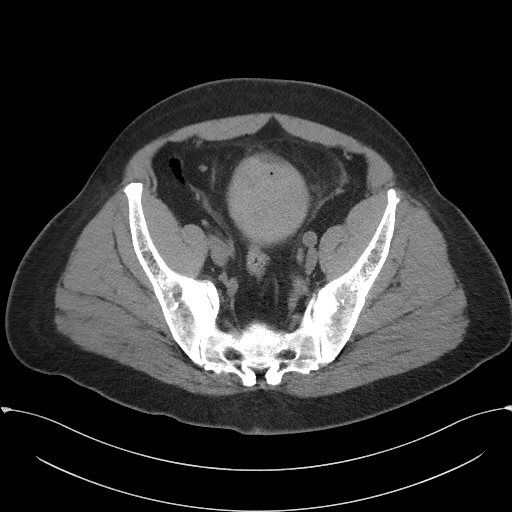
[im 40/105  soft-tissue]
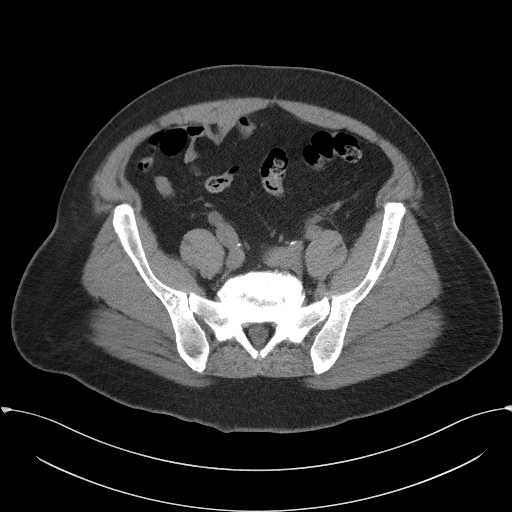
[im 46/105  soft-tissue]
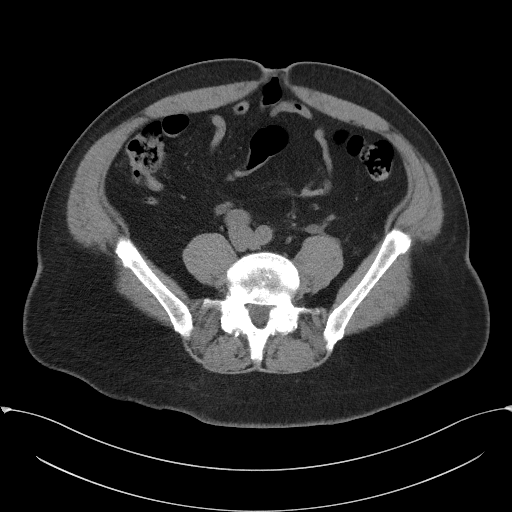
[im 53/105  soft-tissue]
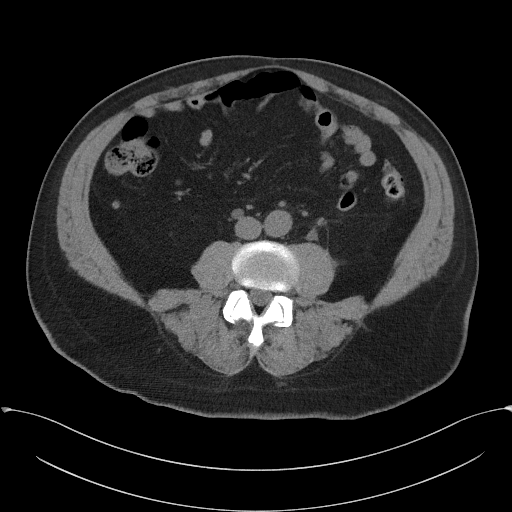
[im 59/105  soft-tissue]
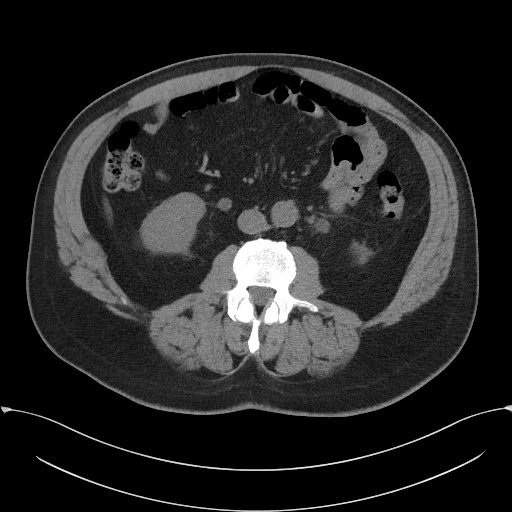
[im 66/105  soft-tissue]
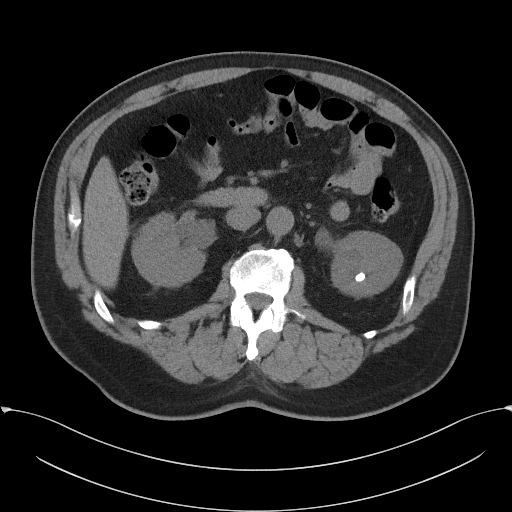
[im 66/105  bone]
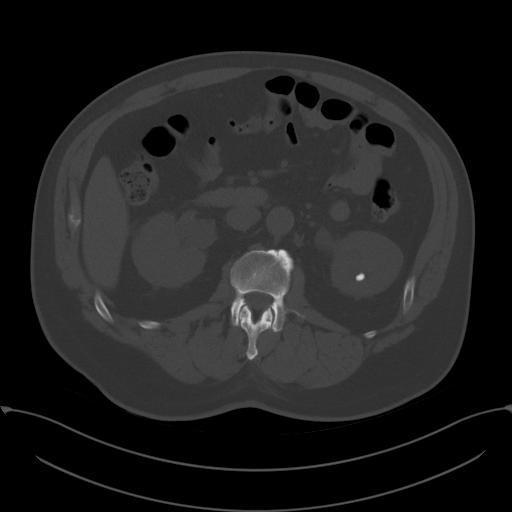
[im 72/105  soft-tissue]
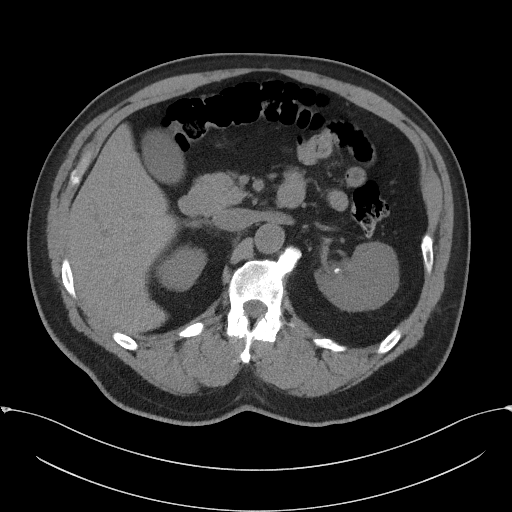
[im 85/105  soft-tissue]
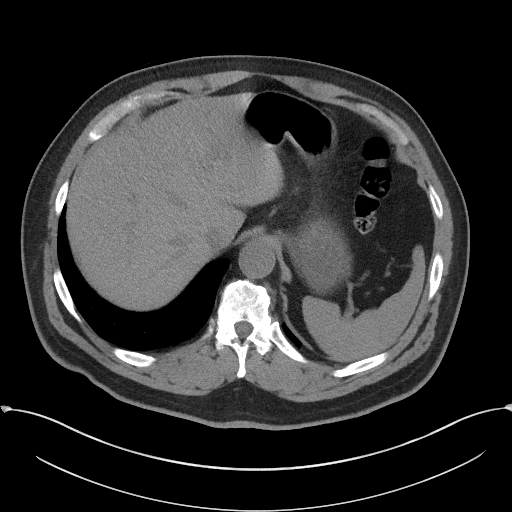
[im 92/105  soft-tissue]
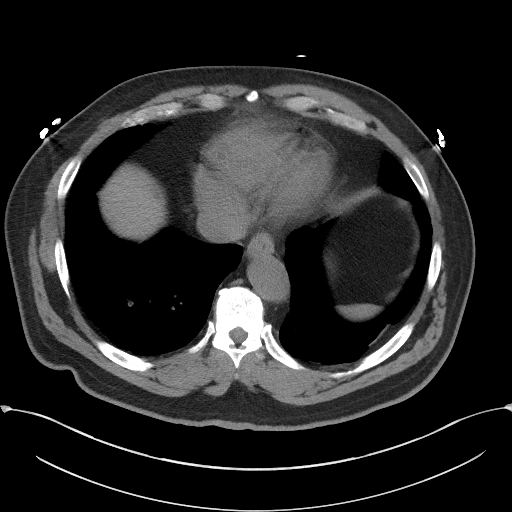
[im 98/105  soft-tissue]
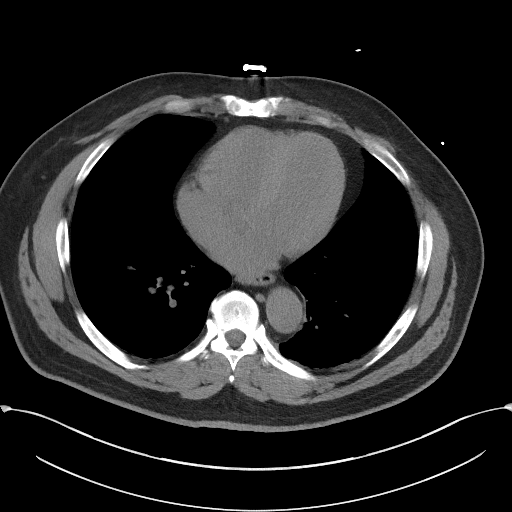

[Series 5: coronal · coronal · 0.88mm/px · 3 of 172 slices shown]
[im 58/172  soft-tissue]
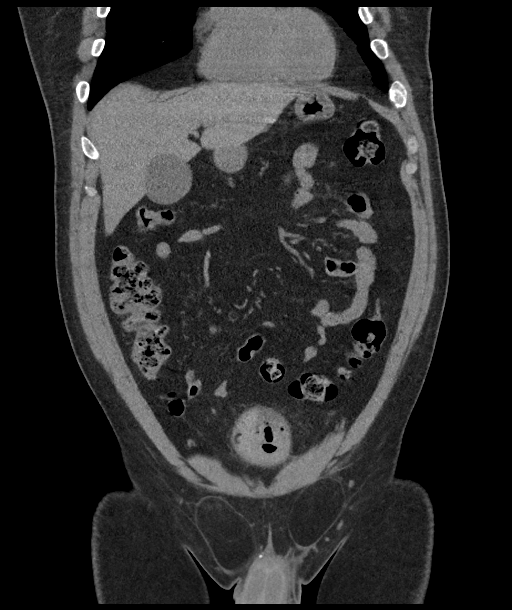
[im 77/172  soft-tissue]
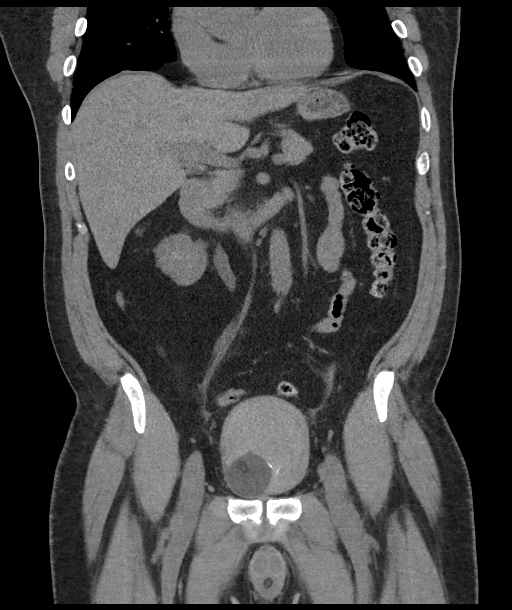
[im 96/172  soft-tissue]
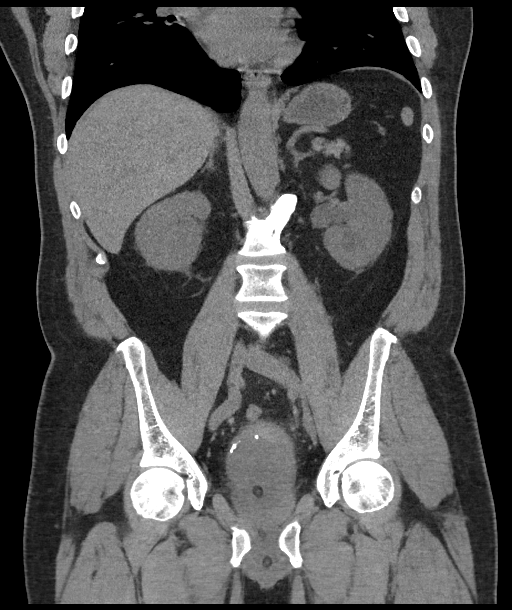

[16 of 46 positions shown; findings below may reference images not displayed]

FINDINGS: Lower chest: 4 mm nodule in the RIGHT middle lobe (image [DATE])

Hepatobiliary: No focal hepatic lesion. No biliary duct dilatation.
Gallbladder is normal. Common bile duct is normal.

Pancreas: Pancreas is normal. No ductal dilatation. No pancreatic
inflammation.

Spleen: Normal spleen

Adrenals/urinary tract: Adrenal glands normal.

RIGHT kidney: bulky branching calcification lower pole of the RIGHT
kidney measures 19 mm (image 45/2). There is mild hydroureter on the
RIGHT. Potential obstructing calculus in the distal RIGHT ureter at
the level of the vesicoureteral junction. Calculus measures 4 mm
(image 78/2).

LEFT kidney: 2 calculi in the LEFT kidney. One in the lower pole
measuring 7 mm. One in the renal pelvis measuring 7 mm. Large
calculus at the LEFT ureteropelvic junction measuring 8 mm (image
45/2). Mild hydronephrosis on the LEFT. No distal LEFT ureteral
calculi.

Foley catheter within the bladder. There is high-density material
with locules of gas in the bladder consistent with blood
products/hematoma. Scattered calcifications within the bladder. The
prostate gland elevates the base the bladder.

Stomach/Bowel: Stomach, small bowel, appendix, and cecum are normal.
The colon and rectosigmoid colon are normal.

Vascular/Lymphatic: Abdominal aorta is normal caliber. No periportal
or retroperitoneal adenopathy. No pelvic adenopathy.

Reproductive: Prostate gland is enlarged measuring 6.7 by 6.2 by
cm (volume = 200 cm^3)

Other: Bilateral fat filled inguinal

Musculoskeletal: Is
IMPRESSION: 1. Postprocedural blood product within the bladder. Scattered small
calculi in the bladder. Foley catheter within the bladder. Bladder
elevated by enlarged prostate gland.
2. Potential distal RIGHT ureteral calculus at the RIGHT
vesicoureteral junction.
3. Partially obstructing calculus at the LEFT ureteropelvic
junction.
4. Bilateral nephrolithiasis.

These results will be called to the ordering clinician or
representative by the Radiologist Assistant, and communication
documented in the PACS or zVision Dashboard.
